# Patient Record
Sex: Female | Born: 1995 | Race: Black or African American | Hispanic: No | Marital: Single | Smoking: Never smoker
Health system: Southern US, Community
[De-identification: ages and names within clinical notes are randomized; demographics above are authoritative.]

## PROBLEM LIST (undated history)

## (undated) ENCOUNTER — Emergency Department: Discharge status: Absent without leave | Payer: Self-pay

---
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
## 2018-09-05 ENCOUNTER — Emergency Department (HOSPITAL_COMMUNITY): Payer: Self-pay

## 2018-09-05 ENCOUNTER — Emergency Department (HOSPITAL_COMMUNITY)
Admission: EM | Admit: 2018-09-05 | Discharge: 2018-09-05 | Disposition: A | Discharge status: Home / Self Care | Payer: Self-pay | Attending: Emergency Medicine | Admitting: Emergency Medicine

## 2018-09-05 ENCOUNTER — Other Ambulatory Visit: Payer: Self-pay

## 2018-09-05 ENCOUNTER — Encounter (HOSPITAL_COMMUNITY): Payer: Self-pay | Admitting: Emergency Medicine

## 2018-09-05 DIAGNOSIS — F410 Panic disorder [episodic paroxysmal anxiety] without agoraphobia: Secondary | ICD-10-CM | POA: Insufficient documentation

## 2018-09-05 LAB — BASIC METABOLIC PANEL
Anion gap: 10 (ref 5–15)
BUN: 11 mg/dL (ref 6–20)
CHLORIDE: 105 mmol/L (ref 98–111)
CO2: 23 mmol/L (ref 22–32)
Calcium: 9 mg/dL (ref 8.9–10.3)
Creatinine, Ser: 0.97 mg/dL (ref 0.44–1.00)
GFR calc non Af Amer: >60 mL/min (ref >60)
GLUCOSE: 131 mg/dL — AB (ref 70–99)
Potassium: 2.9 mmol/L — ABNORMAL LOW (ref 3.5–5.1)
Sodium: 138 mmol/L (ref 135–145)

## 2018-09-05 LAB — CBC
HEMATOCRIT: 37 % (ref 36.0–46.0)
HEMOGLOBIN: 12.4 g/dL (ref 12.0–15.0)
MCH: 31.3 pg (ref 26.0–34.0)
MCHC: 33.5 g/dL (ref 30.0–36.0)
MCV: 93.4 fL (ref 78.0–100.0)
Platelets: 473 10*3/uL — ABNORMAL HIGH (ref 150–400)
RBC: 3.96 MIL/uL (ref 3.87–5.11)
RDW: 12.5 % (ref 11.5–15.5)
WBC: 14.4 10*3/uL — ABNORMAL HIGH (ref 4.0–10.5)

## 2018-09-05 LAB — I-STAT BETA HCG BLOOD, ED (MC, WL, AP ONLY): I-stat hCG, quantitative: <5 m[IU]/mL (ref <5)

## 2018-09-05 MED ORDER — HYDROXYZINE HCL 25 MG PO TABS: 25.0000 mg | ORAL_TABLET | Freq: Three times a day (TID) | ORAL | 0 refills | Status: DC | PRN

## 2018-09-05 MED ORDER — LORAZEPAM 1 MG PO TABS
1.0000 mg | ORAL_TABLET | Freq: Once | ORAL | Status: AC
  Administered 2018-09-05: 1 mg via ORAL
  Filled 2018-09-05: qty 1

## 2018-09-05 NOTE — ED Triage Notes (Signed)
Pt C/O left sided facial and arm numbness that began around 0030. Pt states this started as chest discomfort and she states it "felt like I was having an anxiety attack."

## 2018-09-05 NOTE — ED Provider Notes (Signed)
[facility] EMERGENCY DEPARTMENT Provider Note   CSN: [id number] Arrival date & time: 09/05/18  0118     History   Chief Complaint Chief Complaint  Patient presents with  . Numbness    HPI Tonya Miles is a 22 y.o. female.  Presents to the emergency department for evaluation of chest pain, heart palpitations, shortness of breath, facial and arm numbness.  Patient reports that symptoms began 1 hour ago.  She was sitting and watching TV when symptoms began.  Initially she felt like her heart was racing and tightness in her chest, similar to panic attacks that she has had previously.  Symptoms persisted and then she started having numbness and tingling on the left side of her face and in her left arm.  She reports that these numbness symptoms have mostly resolved, has slight tingling still present in the arm.  She is still sensing palpitations.     History reviewed. No pertinent past medical history.  There are no active problems to display for this patient.   History reviewed. No pertinent surgical history.   OB History   None      Home Medications    Prior to Admission medications   Medication Sig Start Date End Date Taking? Authorizing Provider  hydrOXYzine (ATARAX/VISTARIL) 25 MG tablet Take 1 tablet (25 mg total) by mouth every 8 (eight) hours as needed for anxiety. 09/05/18   Gilda Crease, MD    Family History No family history on file.  Social History Social History   Tobacco Use  . Smoking status: Never Smoker  . Smokeless tobacco: Never Used  Substance Use Topics  . Alcohol use: Not on file  . Drug use: Not on file     Allergies   Patient has no allergy information on record.   Review of Systems Review of Systems  Respiratory: Positive for shortness of breath.   Cardiovascular: Positive for chest pain and palpitations.  Neurological: Positive for numbness.  All other systems reviewed and are negative.    Physical Exam Updated  Vital Signs BP 134/86   Pulse (!) 113   Temp 98.9 F (37.2 C) (Oral)   Resp (!) 22   LMP 08/26/2018   SpO2 99%   Physical Exam  Constitutional: She is oriented to person, place, and time. She appears well-developed and well-nourished. No distress.  HENT:  Head: Normocephalic and atraumatic.  Right Ear: Hearing normal.  Left Ear: Hearing normal.  Nose: Nose normal.  Mouth/Throat: Oropharynx is clear and moist and mucous membranes are normal.  Eyes: Pupils are equal, round, and reactive to light. Conjunctivae and EOM are normal.  Neck: Normal range of motion. Neck supple.  Cardiovascular: Regular rhythm, S1 normal and S2 normal. Tachycardia present. Exam reveals no gallop and no friction rub.  No murmur heard. Pulmonary/Chest: Effort normal and breath sounds normal. No respiratory distress. She exhibits no tenderness.  Abdominal: Soft. Normal appearance and bowel sounds are normal. There is no hepatosplenomegaly. There is no tenderness. There is no rebound, no guarding, no tenderness at McBurney's point and negative Murphy's sign. No hernia.  Musculoskeletal: Normal range of motion.  Neurological: She is alert and oriented to person, place, and time. She has normal strength. No cranial nerve deficit or sensory deficit. Coordination normal. GCS eye subscore is 4. GCS verbal subscore is 5. GCS motor subscore is 6.  Skin: Skin is warm, dry and intact. No rash noted. No cyanosis.  Psychiatric: Her speech is normal and behavior  is normal. Thought content normal. Her mood appears anxious.  Nursing note and vitals reviewed.    ED Treatments / Results  Labs (all labs ordered are listed, but only abnormal results are displayed) Labs Reviewed  CBC - Abnormal; Notable for the following components:      Result Value   WBC 14.4 (*)    Platelets 473 (*)    All other components within normal limits  BASIC METABOLIC PANEL - Abnormal; Notable for the following components:   Potassium 2.9 (*)      Glucose, Bld 131 (*)    All other components within normal limits  I-STAT BETA HCG BLOOD, ED ([facility], [facility], [facility] ONLY)    EKG EKG Interpretation  Date/Time:  Friday September 05 2018 01:29:59 EDT Ventricular Rate:  128 PR Interval:    QRS Duration: 91 QT Interval:  306 QTC Calculation: 447 R Axis:   62 Text Interpretation:  Sinus tachycardia Borderline Q waves in lateral leads Borderline repolarization abnormality No previous tracing Confirmed by Gilda CreasePollina, Din Bookwalter J [id number]) on 09/05/2018 1:34:56 AM   Radiology Dg Chest 2 View  Result Date: 09/05/2018 CLINICAL DATA:  Initial evaluation for acute chest pain. EXAM: CHEST - 2 VIEW COMPARISON:  None. FINDINGS: The cardiac and mediastinal silhouettes are within normal limits. The lungs are normally inflated. No airspace consolidation, pleural effusion, or pulmonary edema is identified. There is no pneumothorax. No acute osseous abnormality identified. IMPRESSION: No active cardiopulmonary disease. Electronically Signed   By: Rise MuBenjamin  McClintock M.D.   On: 09/05/2018 02:03    Procedures Procedures (including critical care time)  Medications Ordered in ED Medications  LORazepam (ATIVAN) tablet 1 mg (1 mg Oral Given 09/05/18 0146)     Initial Impression / Assessment and Plan / ED Course  I have reviewed the triage vital signs and the nursing notes.  Pertinent labs & imaging results that were available during my care of the patient were reviewed by me and considered in my medical decision making (see chart for details).     Patient pesents with shortness of breath, tightness in her chest followed by onset of numbness and tingling on the left side of her body.  Patient has a history of anxiety and panic attack.  Patient was tachycardic at arrival, hypertensive.  No hypoxia.  Lungs are clear.  Chest x-ray is normal, basic lab work-up unremarkable.  No evidence of pregnancy.  Patient administered Ativan with improvement.  Upon reentering the  room, heart rate was in the 90s, she was resting comfortably talking with her aunt.  Her heart rate did come back up after I started talking to her in the room, she appeared to start having more anxiety.  She does not have any risk factors for PE.  Do not feel the patient requires any further work-up at this time, will have her follow-up with her primary care doctor as an outpatient for consideration of further treatment for anxiety and panic disorder.  Final Clinical Impressions(s) / ED Diagnoses   Final diagnoses:  Panic attacks    ED Discharge Orders         Ordered    hydrOXYzine (ATARAX/VISTARIL) 25 MG tablet  Every 8 hours PRN     09/05/18 0250           Gilda CreasePollina, Traycen Goyer J, MD 09/05/18 [phone]

## 2018-09-05 NOTE — ED Notes (Signed)
Pt resting with eyes closed.

## 2019-07-16 ENCOUNTER — Other Ambulatory Visit: Payer: Self-pay | Admitting: Internal Medicine

## 2019-07-16 DIAGNOSIS — Z20822 Contact with and (suspected) exposure to covid-19: Secondary | ICD-10-CM

## 2019-07-19 LAB — NOVEL CORONAVIRUS, NAA: SARS-CoV-2, NAA: NOT DETECTED

## 2019-07-22 ENCOUNTER — Telehealth: Payer: Self-pay | Admitting: General Practice

## 2019-07-22 NOTE — Telephone Encounter (Signed)
Pt made aware of negative results, expressed understanding  °

## 2020-07-29 ENCOUNTER — Ambulatory Visit
Admission: EM | Admit: 2020-07-29 | Discharge: 2020-07-29 | Disposition: A | Discharge status: Home / Self Care | Payer: BC Managed Care – PPO | Attending: Internal Medicine | Admitting: Internal Medicine

## 2020-07-29 ENCOUNTER — Other Ambulatory Visit: Payer: Self-pay

## 2020-07-29 ENCOUNTER — Encounter: Payer: Self-pay | Admitting: Emergency Medicine

## 2020-07-29 DIAGNOSIS — U071 COVID-19: Secondary | ICD-10-CM | POA: Insufficient documentation

## 2020-07-29 DIAGNOSIS — Z79899 Other long term (current) drug therapy: Secondary | ICD-10-CM | POA: Diagnosis not present

## 2020-07-29 DIAGNOSIS — R05 Cough: Secondary | ICD-10-CM | POA: Diagnosis present

## 2020-07-29 DIAGNOSIS — R509 Fever, unspecified: Secondary | ICD-10-CM | POA: Diagnosis not present

## 2020-07-29 DIAGNOSIS — J069 Acute upper respiratory infection, unspecified: Secondary | ICD-10-CM | POA: Insufficient documentation

## 2020-07-29 LAB — SARS CORONAVIRUS 2 (TAT 6-24 HRS): SARS Coronavirus 2: POSITIVE — AB

## 2020-07-29 NOTE — ED Provider Notes (Signed)
[facility]    CSN: [id number] Arrival date & time: 07/29/20  8144      History   Chief Complaint Chief Complaint  Patient presents with   Fever   Cough    HPI Tonya Miles is a 24 y.o. female comes to the urgent care with complaints of fever of 101.2 Fahrenheit, cough of 1 day duration.  Patient is not vaccinated against COVID-19 virus.  She denies any nausea, vomiting or diarrhea.  No generalized body aches.  No sick contacts.  Patient denies any dizziness.  Cough is minimally productive.  No shortness of breath or wheezing.  Patient does not smoke cigarettes. History reviewed. No pertinent past medical history.  There are no problems to display for this patient.   History reviewed. No pertinent surgical history.  OB History   No obstetric history on file.      Home Medications    Prior to Admission medications   Medication Sig Start Date End Date Taking? Authorizing Provider  levocetirizine (XYZAL) 5 MG tablet Take 5 mg by mouth every evening.   Yes [provider]    Family History Family History  Problem Relation Age of Onset   Diabetes Mother    Fibromyalgia Mother     Social History Social History   Tobacco Use   Smoking status: Never Smoker   Smokeless tobacco: Never Used  Building services engineer Use: Never used  Substance Use Topics   Alcohol use: Yes   Drug use: Never     Allergies   Patient has no known allergies.   Review of Systems Review of Systems  Constitutional: Positive for fever. Negative for activity change, chills and fatigue.  HENT: Negative.   Respiratory: Positive for cough. Negative for shortness of breath and wheezing.   Gastrointestinal: Negative.   Genitourinary: Negative.   Neurological: Negative.      Physical Exam Triage Vital Signs ED Triage Vitals  Enc Vitals Group     BP 07/29/20 0850 134/90     Pulse Rate 07/29/20 0850 90     Resp 07/29/20 0850 14     Temp 07/29/20 0850  98.8 F (37.1 C)     Temp Source 07/29/20 0850 Oral     SpO2 07/29/20 0850 99 %     Weight 07/29/20 0844 150 lb (68 kg)     Height 07/29/20 0844 5\' 2"  (1.575 m)     Head Circumference --      Peak Flow --      Pain Score 07/29/20 0844 2     Pain Loc --      Pain Edu? --      Excl. in GC? --    No data found.  Updated Vital Signs BP 134/90 (BP Location: Right Arm)    Pulse 90    Temp 98.8 F (37.1 C) (Oral)    Resp 14    Ht 5\' 2"  (1.575 m)    Wt 68 kg    LMP 07/25/2020 (Exact Date)    SpO2 99%    BMI 27.44 kg/m   Visual Acuity Right Eye Distance:   Left Eye Distance:   Bilateral Distance:    Right Eye Near:   Left Eye Near:    Bilateral Near:     Physical Exam Vitals reviewed.  HENT:     Right Ear: Tympanic membrane normal.     Left Ear: Tympanic membrane normal.     Mouth/Throat:  Mouth: Mucous membranes are moist.     Pharynx: No oropharyngeal exudate or posterior oropharyngeal erythema.  Cardiovascular:     Rate and Rhythm: Normal rate and regular rhythm.     Pulses: Normal pulses.     Heart sounds: Normal heart sounds.  Abdominal:     General: Bowel sounds are normal. There is no distension.     Palpations: Abdomen is soft.     Tenderness: There is no guarding.     Hernia: No hernia is present.  Musculoskeletal:     Cervical back: Normal range of motion. No rigidity.  Lymphadenopathy:     Cervical: No cervical adenopathy.      UC Treatments / Results  Labs (all labs ordered are listed, but only abnormal results are displayed) Labs Reviewed  SARS CORONAVIRUS 2 (TAT 6-24 HRS)    EKG   Radiology No results found.  Procedures Procedures (including critical care time)  Medications Ordered in UC Medications - No data to display  Initial Impression / Assessment and Plan / UC Course  I have reviewed the triage vital signs and the nursing notes.  Pertinent labs & imaging results that were available during my care of the patient were reviewed by  me and considered in my medical decision making (see chart for details).     1.  Viral URI with cough: COVID-19 PCR test sent Covid vaccination counseling was given.  Patient's questions and concerns were answered. Patient is advised to quarantine until COVID-19 test results are available If patient symptoms worsens he is advised to return to urgent care to be reevaluated. Final Clinical Impressions(s) / UC Diagnoses   Final diagnoses:  Viral URI with cough     Discharge Instructions     Please quarantine until covid-19 test results are available. Please return to urgent care if your symptoms worsen.   ED Prescriptions    None     PDMP not reviewed this encounter.   Merrilee Jansky, MD 07/29/20 [phone]

## 2020-07-29 NOTE — ED Triage Notes (Signed)
Patient c/o cough and fever that started yesterday.

## 2020-07-29 NOTE — Discharge Instructions (Signed)
Please quarantine until covid-19 test results are available. Please return to urgent care if your symptoms worsen.

## 2020-08-02 ENCOUNTER — Other Ambulatory Visit: Payer: Self-pay

## 2020-08-02 ENCOUNTER — Emergency Department (HOSPITAL_COMMUNITY)
Admission: EM | Admit: 2020-08-02 | Discharge: 2020-08-03 | Disposition: A | Discharge status: Home / Self Care | Payer: BC Managed Care – PPO | Attending: Emergency Medicine | Admitting: Emergency Medicine

## 2020-08-02 DIAGNOSIS — U071 COVID-19: Secondary | ICD-10-CM | POA: Insufficient documentation

## 2020-08-02 DIAGNOSIS — R0981 Nasal congestion: Secondary | ICD-10-CM | POA: Diagnosis not present

## 2020-08-02 DIAGNOSIS — R0789 Other chest pain: Secondary | ICD-10-CM | POA: Insufficient documentation

## 2020-08-02 DIAGNOSIS — R05 Cough: Secondary | ICD-10-CM | POA: Insufficient documentation

## 2020-08-02 DIAGNOSIS — R112 Nausea with vomiting, unspecified: Secondary | ICD-10-CM | POA: Insufficient documentation

## 2020-08-02 MED ORDER — SODIUM CHLORIDE 0.9 % IV BOLUS
1000.0000 mL | Freq: Once | INTRAVENOUS | Status: AC
  Administered 2020-08-02: 1000 mL via INTRAVENOUS

## 2020-08-02 MED ORDER — ONDANSETRON HCL 4 MG/2ML IJ SOLN
4.0000 mg | Freq: Once | INTRAMUSCULAR | Status: AC
  Administered 2020-08-03: 4 mg via INTRAVENOUS
  Filled 2020-08-02: qty 2

## 2020-08-02 NOTE — ED Provider Notes (Signed)
[facility] EMERGENCY DEPARTMENT Provider Note   CSN: [id number] Arrival date & time: 08/02/20  2215     History Chief Complaint  Patient presents with  . Emesis  . COVD+    8/13    Tonya Miles is a 24 y.o. female.  Patient presents to the ED for nausea and vomiting.  Patient was diagnosed with Covid 4 days ago.  At that time she was having nasal congestion and cough.  Patient reports that the cough has resolved but she does feel some tightness on the right side of her chest when she breathes.  She is not short of breath.  Patient has not had any abdominal pain associated with nausea and vomiting tonight.  She has not had any recent fever.        No past medical history on file.  There are no problems to display for this patient.   No past surgical history on file.   OB History   No obstetric history on file.     Family History  Problem Relation Age of Onset  . Diabetes Mother   . Fibromyalgia Mother     Social History   Tobacco Use  . Smoking status: Never Smoker  . Smokeless tobacco: Never Used  Vaping Use  . Vaping Use: Never used  Substance Use Topics  . Alcohol use: Yes  . Drug use: Never    Home Medications Prior to Admission medications   Medication Sig Start Date End Date Taking? Authorizing Provider  levocetirizine (XYZAL) 5 MG tablet Take 5 mg by mouth every evening.    [provider]    Allergies    Patient has no known allergies.  Review of Systems   Review of Systems  Respiratory: Positive for chest tightness.   Gastrointestinal: Positive for nausea and vomiting.  All other systems reviewed and are negative.   Physical Exam Updated Vital Signs BP (!) 150/90   Pulse 79   Temp 98.5 F (36.9 C) (Oral)   Resp 16   Ht 5\' 2"  (1.575 m)   Wt 68 kg   LMP 07/25/2020 (Exact Date)   SpO2 99%   BMI 27.44 kg/m   Physical Exam Vitals and nursing note reviewed.  Constitutional:      General: She is not in acute  distress.    Appearance: Normal appearance. She is well-developed.  HENT:     Head: Normocephalic and atraumatic.     Right Ear: Hearing normal.     Left Ear: Hearing normal.     Nose: Nose normal.  Eyes:     Conjunctiva/sclera: Conjunctivae normal.     Pupils: Pupils are equal, round, and reactive to light.  Cardiovascular:     Rate and Rhythm: Regular rhythm.     Heart sounds: S1 normal and S2 normal. No murmur heard.  No friction rub. No gallop.   Pulmonary:     Effort: Pulmonary effort is normal. No respiratory distress.     Breath sounds: Normal breath sounds.  Chest:     Chest wall: No tenderness.  Abdominal:     General: Bowel sounds are normal.     Palpations: Abdomen is soft.     Tenderness: There is no abdominal tenderness. There is no guarding or rebound. Negative signs include Murphy's sign and McBurney's sign.     Hernia: No hernia is present.  Musculoskeletal:        General: Normal range of motion.     Cervical back:  Normal range of motion and neck supple.  Skin:    General: Skin is warm and dry.     Findings: No rash.  Neurological:     Mental Status: She is alert and oriented to person, place, and time.     GCS: GCS eye subscore is 4. GCS verbal subscore is 5. GCS motor subscore is 6.     Cranial Nerves: No cranial nerve deficit.     Sensory: No sensory deficit.     Coordination: Coordination normal.  Psychiatric:        Speech: Speech normal.        Behavior: Behavior normal.        Thought Content: Thought content normal.     ED Results / Procedures / Treatments   Labs (all labs ordered are listed, but only abnormal results are displayed) Labs Reviewed  COMPREHENSIVE METABOLIC PANEL - Abnormal; Notable for the following components:      Result Value   Glucose, Bld 102 (*)    All other components within normal limits  URINALYSIS, ROUTINE W REFLEX MICROSCOPIC - Abnormal; Notable for the following components:   APPearance HAZY (*)    Hgb urine  dipstick SMALL (*)    Leukocytes,Ua TRACE (*)    All other components within normal limits  CBC WITH DIFFERENTIAL/PLATELET  LIPASE, BLOOD  D-DIMER, QUANTITATIVE (NOT AT [facility])  POC URINE PREG, ED    EKG None  Radiology No results found.  Procedures Procedures (including critical care time)  Medications Ordered in ED Medications  sodium chloride 0.9 % bolus 1,000 mL (1,000 mLs Intravenous New Bag/Given 08/02/20 2359)  ondansetron (ZOFRAN) injection 4 mg (4 mg Intravenous Given 08/03/20 0000)    ED Course  I have reviewed the triage vital signs and the nursing notes.  Pertinent labs & imaging results that were available during my care of the patient were reviewed by me and considered in my medical decision making (see chart for details).    MDM Rules/Calculators/A&P                         Patient presents to the emergency department mainly for evaluation of nausea and vomiting which began today.  Patient is not experiencing any concomitant abdominal pain.  Abdominal exam is benign, nontender.  No concern for an acute surgical process.  Patient also reports that she feels a tightness in the right side of her chest with breathing.  She has not tachycardic, tachypneic or hypoxic.  She did test positive for Covid earlier this week.  Covid pneumonia and PE are therefore in the differential diagnosis.  D-dimer, however, is negative.  Therefore doubt PE.  Chest x-ray does not show any obvious pneumonia.  Patient's work-up is very reassuring.  She feels and looks well.  Will discharge with symptomatic treatment.  Was given printed return precautions.  Final Clinical Impression(s) / ED Diagnoses Final diagnoses:  Non-intractable vomiting with nausea, unspecified vomiting type  COVID-19    Rx / DC Orders ED Discharge Orders    None       Daesha Insco, Canary Brim, MD 08/03/20 [phone]

## 2020-08-02 NOTE — ED Triage Notes (Addendum)
Pt POV from home c/o emesis today. COVID + 8/13.  Emesis x1. Denies -nausea -cough

## 2020-08-03 ENCOUNTER — Emergency Department (HOSPITAL_COMMUNITY): Payer: BC Managed Care – PPO

## 2020-08-03 LAB — URINALYSIS, ROUTINE W REFLEX MICROSCOPIC
Bacteria, UA: NONE SEEN
Bilirubin Urine: NEGATIVE
Glucose, UA: NEGATIVE mg/dL
Ketones, ur: NEGATIVE mg/dL
Nitrite: NEGATIVE
Protein, ur: NEGATIVE mg/dL
Specific Gravity, Urine: 1.019 (ref 1.005–1.030)
pH: 6 (ref 5.0–8.0)

## 2020-08-03 LAB — COMPREHENSIVE METABOLIC PANEL
ALT: 26 U/L (ref 0–44)
AST: 22 U/L (ref 15–41)
Albumin: 4.3 g/dL (ref 3.5–5.0)
Alkaline Phosphatase: 63 U/L (ref 38–126)
Anion gap: 9 (ref 5–15)
BUN: 12 mg/dL (ref 6–20)
CO2: 24 mmol/L (ref 22–32)
Calcium: 8.9 mg/dL (ref 8.9–10.3)
Chloride: 107 mmol/L (ref 98–111)
Creatinine, Ser: 0.8 mg/dL (ref 0.44–1.00)
GFR calc Af Amer: >60 mL/min (ref >60)
GFR calc non Af Amer: >60 mL/min (ref >60)
Glucose, Bld: 102 mg/dL — ABNORMAL HIGH (ref 70–99)
Potassium: 3.7 mmol/L (ref 3.5–5.1)
Sodium: 140 mmol/L (ref 135–145)
Total Bilirubin: 0.3 mg/dL (ref 0.3–1.2)
Total Protein: 7.7 g/dL (ref 6.5–8.1)

## 2020-08-03 LAB — CBC WITH DIFFERENTIAL/PLATELET
Abs Immature Granulocytes: 0.01 10*3/uL (ref 0.00–0.07)
Basophils Absolute: 0 10*3/uL (ref 0.0–0.1)
Basophils Relative: 0 %
Eosinophils Absolute: 0.1 10*3/uL (ref 0.0–0.5)
Eosinophils Relative: 1 %
HCT: 38.9 % (ref 36.0–46.0)
Hemoglobin: 12.7 g/dL (ref 12.0–15.0)
Immature Granulocytes: 0 %
Lymphocytes Relative: 23 %
Lymphs Abs: 1.7 10*3/uL (ref 0.7–4.0)
MCH: 30.7 pg (ref 26.0–34.0)
MCHC: 32.6 g/dL (ref 30.0–36.0)
MCV: 94 fL (ref 80.0–100.0)
Monocytes Absolute: 0.7 10*3/uL (ref 0.1–1.0)
Monocytes Relative: 9 %
Neutro Abs: 4.9 10*3/uL (ref 1.7–7.7)
Neutrophils Relative %: 67 %
Platelets: 300 10*3/uL (ref 150–400)
RBC: 4.14 MIL/uL (ref 3.87–5.11)
RDW: 12.1 % (ref 11.5–15.5)
WBC: 7.4 10*3/uL (ref 4.0–10.5)
nRBC: 0 % (ref 0.0–0.2)

## 2020-08-03 LAB — D-DIMER, QUANTITATIVE: D-Dimer, Quant: 0.27 ug/mL-FEU (ref 0.00–0.50)

## 2020-08-03 LAB — POC URINE PREG, ED: Preg Test, Ur: NEGATIVE

## 2020-08-03 LAB — LIPASE, BLOOD: Lipase: 33 U/L (ref 11–51)

## 2020-08-03 MED ORDER — DEXAMETHASONE SODIUM PHOSPHATE 10 MG/ML IJ SOLN
10.0000 mg | Freq: Once | INTRAMUSCULAR | Status: AC
  Administered 2020-08-03: 10 mg via INTRAVENOUS
  Filled 2020-08-03: qty 1

## 2020-08-03 MED ORDER — ONDANSETRON HCL 4 MG PO TABS: 4.0000 mg | ORAL_TABLET | Freq: Four times a day (QID) | ORAL | 0 refills | Status: DC

## 2020-09-08 ENCOUNTER — Other Ambulatory Visit: Payer: Self-pay

## 2020-09-08 ENCOUNTER — Ambulatory Visit
Admission: EM | Admit: 2020-09-08 | Discharge: 2020-09-08 | Disposition: A | Discharge status: Home / Self Care | Payer: BC Managed Care – PPO | Attending: Emergency Medicine | Admitting: Emergency Medicine

## 2020-09-08 DIAGNOSIS — R350 Frequency of micturition: Secondary | ICD-10-CM | POA: Insufficient documentation

## 2020-09-08 LAB — POCT URINALYSIS DIP (MANUAL ENTRY)
Bilirubin, UA: NEGATIVE
Glucose, UA: NEGATIVE mg/dL
Ketones, POC UA: NEGATIVE mg/dL
Nitrite, UA: NEGATIVE
Protein Ur, POC: NEGATIVE mg/dL
Spec Grav, UA: 1.01 (ref 1.010–1.025)
Urobilinogen, UA: 0.2 E.U./dL
pH, UA: 6 (ref 5.0–8.0)

## 2020-09-08 LAB — POCT URINE PREGNANCY: Preg Test, Ur: NEGATIVE

## 2020-09-08 MED ORDER — NITROFURANTOIN MONOHYD MACRO 100 MG PO CAPS: 100.0000 mg | ORAL_CAPSULE | Freq: Two times a day (BID) | ORAL | 0 refills | Status: DC

## 2020-09-08 NOTE — Discharge Instructions (Addendum)
Urine concerning for UTI Urine culture sent.  We will call you with abnormal results.   Push fluids and get plenty of rest.   Take antibiotic as directed and to completion Follow up with PCP if symptoms persists Return here or go to ER if you have any new or worsening symptoms such as fever, worsening abdominal pain, nausea/vomiting, flank pain, etc... 

## 2020-09-08 NOTE — ED Triage Notes (Signed)
Pt presents with c/o urinary frequency

## 2020-09-08 NOTE — ED Provider Notes (Signed)
[facility]   CC: Urinary frequency  SUBJECTIVE:  Tonya Miles is a 24 y.o. female who complains of urinary frequency and urgency x 3 days.  Admits to excessive caffeine.  Denies pain.  Has NOT tried OTC medications.  Symptoms are made worse with urination.  Admits to similar symptoms in the past.  Denies fever, chills, nausea, vomiting, abdominal pain, flank pain, abnormal vaginal discharge or bleeding, hematuria.    LMP: Patient's last menstrual period was 08/14/2020 (approximate).  ROS: As in HPI.  All other pertinent ROS negative.     History reviewed. No pertinent past medical history. History reviewed. No pertinent surgical history. No Known Allergies No current facility-administered medications on file prior to encounter.   Current Outpatient Medications on File Prior to Encounter  Medication Sig Dispense Refill  . levocetirizine (XYZAL) 5 MG tablet Take 5 mg by mouth every evening.     Social History   Socioeconomic History  . Marital status: Single    Spouse name: Not on file  . Number of children: Not on file  . Years of education: Not on file  . Highest education level: Not on file  Occupational History  . Not on file  Tobacco Use  . Smoking status: Never Smoker  . Smokeless tobacco: Never Used  Vaping Use  . Vaping Use: Never used  Substance and Sexual Activity  . Alcohol use: Yes  . Drug use: Never  . Sexual activity: Not on file  Other Topics Concern  . Not on file  Social History Narrative  . Not on file   Social Determinants of Health   Financial Resource Strain:   . Difficulty of Paying Living Expenses: Not on file  Food Insecurity:   . Worried About Programme researcher, broadcasting/film/video in the Last Year: Not on file  . Ran Out of Food in the Last Year: Not on file  Transportation Needs:   . Lack of Transportation (Medical): Not on file  . Lack of Transportation (Non-Medical): Not on file  Physical Activity:   . Days of Exercise per Week: Not on  file  . Minutes of Exercise per Session: Not on file  Stress:   . Feeling of Stress : Not on file  Social Connections:   . Frequency of Communication with Friends and Family: Not on file  . Frequency of Social Gatherings with Friends and Family: Not on file  . Attends Religious Services: Not on file  . Active Member of Clubs or Organizations: Not on file  . Attends Banker Meetings: Not on file  . Marital Status: Not on file  Intimate Partner Violence:   . Fear of Current or Ex-Partner: Not on file  . Emotionally Abused: Not on file  . Physically Abused: Not on file  . Sexually Abused: Not on file   Family History  Problem Relation Age of Onset  . Diabetes Mother   . Fibromyalgia Mother     OBJECTIVE:  Vitals:   09/08/20 1820  BP: (!) 144/83  Pulse: 93  Resp: 17  Temp: 98.5 F (36.9 C)  TempSrc: Oral  SpO2: 98%   General appearance: Alert in no acute distress HEENT: NCAT.  Oropharynx clear.  Lungs: clear to auscultation bilaterally without adventitious breath sounds Heart: regular rate and rhythm.   Abdomen: soft; non-distended; no tenderness; bowel sounds present; no guarding  Back: no CVA tenderness Extremities: no edema; symmetrical with no gross deformities Skin: warm and dry Neurologic: Ambulates from  chair to exam table without difficulty Psychological: alert and cooperative; normal mood and affect  Labs Reviewed  POCT URINALYSIS DIP (MANUAL ENTRY) - Abnormal; Notable for the following components:      Result Value   Blood, UA trace-intact (*)    Leukocytes, UA Small (1+) (*)    All other components within normal limits  URINE CULTURE  POCT URINE PREGNANCY    ASSESSMENT & PLAN:  1. Urinary frequency     Meds ordered this encounter  Medications  . nitrofurantoin, macrocrystal-monohydrate, (MACROBID) 100 MG capsule    Sig: Take 1 capsule (100 mg total) by mouth 2 (two) times daily.    Dispense:  10 capsule    Refill:  0    Order  Specific Question:   Supervising Provider    Answer:   Eustace Moore [[id number]]   Urine concerning for UTI Urine culture sent.  We will call you with abnormal results.   Push fluids and get plenty of rest.   Take antibiotic as directed and to completion Take pyridium as prescribed and as needed for symptomatic relief Follow up with PCP if symptoms persists Return here or go to ER if you have any new or worsening symptoms such as fever, worsening abdominal pain, nausea/vomiting, flank pain, etc...  Outlined signs and symptoms indicating need for more acute intervention. Patient verbalized understanding. After Visit Summary given.     Rennis Harding, PA-C 09/08/20 1839

## 2020-09-10 LAB — URINE CULTURE

## 2020-12-11 ENCOUNTER — Ambulatory Visit
Admission: EM | Admit: 2020-12-11 | Discharge: 2020-12-11 | Disposition: A | Discharge status: Home / Self Care | Payer: BC Managed Care – PPO | Attending: Family Medicine | Admitting: Family Medicine

## 2020-12-11 ENCOUNTER — Other Ambulatory Visit: Payer: Self-pay

## 2020-12-11 DIAGNOSIS — R300 Dysuria: Secondary | ICD-10-CM

## 2020-12-11 DIAGNOSIS — R350 Frequency of micturition: Secondary | ICD-10-CM

## 2020-12-11 DIAGNOSIS — N3000 Acute cystitis without hematuria: Secondary | ICD-10-CM

## 2020-12-11 LAB — POCT URINALYSIS DIP (MANUAL ENTRY)
Bilirubin, UA: NEGATIVE
Glucose, UA: NEGATIVE mg/dL
Leukocytes, UA: NEGATIVE
Nitrite, UA: NEGATIVE
Protein Ur, POC: NEGATIVE mg/dL
Spec Grav, UA: 1.025 (ref 1.010–1.025)
Urobilinogen, UA: 0.2 E.U./dL
pH, UA: 7 (ref 5.0–8.0)

## 2020-12-11 MED ORDER — CEPHALEXIN 500 MG PO CAPS: 500.0000 mg | ORAL_CAPSULE | Freq: Two times a day (BID) | ORAL | 0 refills | Status: AC

## 2020-12-11 NOTE — ED Provider Notes (Signed)
[facility]   CC: UTI  SUBJECTIVE:  Tonya Miles is a 24 y.o. female who complains of urinary frequency, urgency and dysuria for the past 2 days.  Patient denies a precipitating event, recent sexual encounter, excessive caffeine intake. Localizes the pain to the lower abdomen. Pain is intermittent and describes it as burning. Has not tried OTC medications. Symptoms are made worse with urination. Admits to similar symptoms in the past.  Denies fever, chills, nausea, vomiting, flank pain, abnormal vaginal discharge or bleeding, hematuria.    LMP: No LMP recorded (lmp unknown).  ROS: As in HPI.  All other pertinent ROS negative.     No past medical history on file. No past surgical history on file. No Known Allergies No current facility-administered medications on file prior to encounter.   Current Outpatient Medications on File Prior to Encounter  Medication Sig Dispense Refill   levocetirizine (XYZAL) 5 MG tablet Take 5 mg by mouth every evening.     Social History   Socioeconomic History   Marital status: Single    Spouse name: Not on file   Number of children: Not on file   Years of education: Not on file   Highest education level: Not on file  Occupational History   Not on file  Tobacco Use   Smoking status: Never Smoker   Smokeless tobacco: Never Used  Vaping Use   Vaping Use: Never used  Substance and Sexual Activity   Alcohol use: Yes   Drug use: Never   Sexual activity: Not on file  Other Topics Concern   Not on file  Social History Narrative   Not on file   Social Determinants of Health   Financial Resource Strain: Not on file  Food Insecurity: Not on file  Transportation Needs: Not on file  Physical Activity: Not on file  Stress: Not on file  Social Connections: Not on file  Intimate Partner Violence: Not on file   Family History  Problem Relation Age of Onset   Diabetes Mother    Fibromyalgia Mother      OBJECTIVE:  Vitals:   12/11/20 1526  BP: (!) 134/91  Pulse: 80  Resp: 18  Temp: 98 F (36.7 C)  SpO2: 99%   General appearance: AOx3 in no acute distress HEENT: NCAT. Oropharynx clear.  Lungs: clear to auscultation bilaterally without adventitious breath sounds Heart: regular rate and rhythm. Radial pulses 2+ symmetrical bilaterally Abdomen: soft; non-distended; suprapubic tenderness; bowel sounds present; no guarding or rebound tenderness Back: no CVA tenderness Extremities: no edema; symmetrical with no gross deformities Skin: warm and dry Neurologic: Ambulates from chair to exam table without difficulty Psychological: alert and cooperative; normal mood and affect  Labs Reviewed  POCT URINALYSIS DIP (MANUAL ENTRY) - Abnormal; Notable for the following components:      Result Value   Clarity, UA hazy (*)    Ketones, POC UA trace (5) (*)    Blood, UA trace-intact (*)    All other components within normal limits  URINE CULTURE    ASSESSMENT & PLAN:  1. Acute cystitis without hematuria   2. Dysuria   3. Urinary frequency     Meds ordered this encounter  Medications   cephALEXin (KEFLEX) 500 MG capsule    Sig: Take 1 capsule (500 mg total) by mouth 2 (two) times daily for 7 days.    Dispense:  14 capsule    Refill:  0    Order Specific Question:  Supervising Provider    Answer:   Merrilee Jansky [[id number]]   UA concerning for UTI Prescribed Keflex Urine culture sent  We will call you with abnormal results that need further treatment Push fluids and get plenty of rest Take antibiotic as directed and to completion Take pyridium as needed for symptomatic relief Follow up with PCP if symptoms persists Return here or go to ER if you have any new or worsening symptoms such as fever, worsening abdominal pain, nausea/vomiting, flank pain  Outlined signs and symptoms indicating need for more acute intervention Patient verbalized understanding After Visit  Summary given     Moshe Cipro, NP 12/13/20 [phone]

## 2020-12-11 NOTE — Discharge Instructions (Signed)
I have sent in Keflex for you to take twice a day for 7 days  We have sent your urine for a culture. We will call you with results that require further treatment  Follow up with this office or with primary care if symptoms are persisting.  Follow up in the ER for high fever, trouble swallowing, trouble breathing, other concerning symptoms.

## 2020-12-14 LAB — URINE CULTURE

## 2021-01-24 ENCOUNTER — Other Ambulatory Visit: Payer: Self-pay

## 2021-01-24 ENCOUNTER — Ambulatory Visit: Payer: Self-pay | Admitting: Family Medicine

## 2021-01-24 ENCOUNTER — Encounter: Payer: Self-pay | Admitting: Family Medicine

## 2021-01-24 DIAGNOSIS — Z113 Encounter for screening for infections with a predominantly sexual mode of transmission: Secondary | ICD-10-CM

## 2021-01-24 LAB — WET PREP FOR TRICH, YEAST, CLUE: Trichomonas Exam: NEGATIVE

## 2021-01-24 LAB — HIV ANTIBODY (ROUTINE TESTING W REFLEX): HIV 1&2 Ab, 4th Generation: NONREACTIVE

## 2021-01-24 NOTE — Progress Notes (Signed)
[employer] results reviewed by provider. Tawny Hopping, RN

## 2021-01-24 NOTE — Progress Notes (Signed)
[facility] Department STI clinic/screening visit  Subjective:  Tonya Miles is a 25 y.o. female being seen today for an STI screening visit. The patient reports they do not have symptoms.  Patient reports that they do not desire a pregnancy in the next year.   They reported they are not interested in discussing contraception today.  Patient's last menstrual period was 01/01/2021.   Patient has the following medical conditions:  There are no problems to display for this patient.   Chief Complaint  Patient presents with  . STD Screening    HPI  Patient reports here for STI testing, partner was unfaithful and wants to be screened.    Last HIV test per patient was 2019 Patient reports last pap was 2019  See flowsheet for further details and programmatic requirements.    The following portions of the patient's history were reviewed and updated as appropriate: allergies, current medications, past medical history, past social history, past surgical history and problem list.  Objective:  There were no vitals filed for this visit.  Physical Exam Vitals and nursing note reviewed.  Constitutional:      Appearance: Normal appearance.  HENT:     Head: Normocephalic and atraumatic.     Mouth/Throat:     Mouth: Mucous membranes are moist.     Pharynx: Oropharynx is clear. No oropharyngeal exudate or posterior oropharyngeal erythema.  Pulmonary:     Effort: Pulmonary effort is normal.  Chest:  Breasts:     Right: No axillary adenopathy or supraclavicular adenopathy.     Left: No axillary adenopathy or supraclavicular adenopathy.    Abdominal:     General: Abdomen is flat.     Palpations: Abdomen is soft. There is no mass.     Tenderness: There is no abdominal tenderness. There is no rebound.  Genitourinary:    Exam position: Lithotomy position.     Pubic Area: No rash or pubic lice.      Labia:        Right: No rash or lesion.        Left: No rash or lesion.       Vagina: Normal. No vaginal discharge, erythema, bleeding or lesions.     Cervix: No cervical motion tenderness, discharge, friability, lesion or erythema.     Uterus: Normal.      Adnexa: Right adnexa normal and left adnexa normal.     Comments: Deferred, patient self collected specimens  Musculoskeletal:     Cervical back: Normal range of motion and neck supple.  Lymphadenopathy:     Head:     Right side of head: No preauricular or posterior auricular adenopathy.     Left side of head: No preauricular or posterior auricular adenopathy.     Cervical: No cervical adenopathy.     Upper Body:     Right upper body: No supraclavicular or axillary adenopathy.     Left upper body: No supraclavicular or axillary adenopathy.     Lower Body: No right inguinal adenopathy. No left inguinal adenopathy.  Skin:    General: Skin is warm and dry.     Findings: No rash.  Neurological:     Mental Status: She is alert and oriented to person, place, and time.  Psychiatric:        Mood and Affect: Mood normal.        Behavior: Behavior normal.      Assessment and Plan:  Tonya Miles is a 25  y.o. female presenting to the [facility] Department for STI screening  1. Screening examination for venereal disease - Chlamydia/Gonorrhea [facility] - WET PREP FOR TRICH, YEAST, CLUE - HIV [place] [facility] - Syphilis Serology, [facility]  Patient accepted all screenings including oral GC, wet prep, vaginal CT/GC and bloodwork for HIV/RPR.  Patient meets criteria for HepB screening? No. Ordered? No - does not meet criteria  Patient meets criteria for HepC screening? No. Ordered? No - does not meet critera   Nursing: Treat wet prep per standing order.    Discussed time line for [facility] results and that patient will be called with positive results and encouraged patient to call if she had not heard in 2 weeks.   Counseled to return or seek care for continued or worsening  symptoms Recommended condom use with all sex  Patient is currently using no birth control method  to prevent pregnancy.     Return for as needed.  No future appointments.  Wendi Snipes, FNP

## 2021-01-24 NOTE — Progress Notes (Signed)
Here today for STD screening. Accepts bloodwork. Boy Delamater, RN ° °

## 2021-01-29 LAB — GONOCOCCUS CULTURE

## 2021-01-30 ENCOUNTER — Other Ambulatory Visit: Payer: Self-pay

## 2021-01-30 NOTE — Progress Notes (Signed)
RN abstracted HIV non reactive. Harvie Heck, RN

## 2021-04-25 ENCOUNTER — Other Ambulatory Visit: Payer: Self-pay

## 2021-04-25 ENCOUNTER — Ambulatory Visit
Admission: EM | Admit: 2021-04-25 | Discharge: 2021-04-25 | Disposition: A | Discharge status: Home / Self Care | Payer: Medicaid Other | Attending: Family Medicine | Admitting: Family Medicine

## 2021-04-25 DIAGNOSIS — H66003 Acute suppurative otitis media without spontaneous rupture of ear drum, bilateral: Secondary | ICD-10-CM

## 2021-04-25 MED ORDER — FLUCONAZOLE 150 MG PO TABS: ORAL_TABLET | ORAL | 0 refills | Status: DC

## 2021-04-25 MED ORDER — AMOXICILLIN-POT CLAVULANATE 875-125 MG PO TABS: 1.0000 | ORAL_TABLET | Freq: Two times a day (BID) | ORAL | 0 refills | Status: AC

## 2021-04-25 NOTE — ED Triage Notes (Signed)
Pt presents with c/o chest congestion and cough since last week also has left ear pain. Pt has had 2 negative home covid test

## 2021-04-25 NOTE — Discharge Instructions (Signed)
I have sent in Augmentin for you to take twice a day for 7 days.  I have sent in fluconazole in case of yeast. Take one tablet at the onset of symptoms. If symptoms are still present in 3 days, take the second tablet.   Follow up with this office or with primary care if symptoms are persisting.  Follow up in the ER for high fever, trouble swallowing, trouble breathing, other concerning symptoms.  

## 2021-04-25 NOTE — ED Provider Notes (Signed)
[facility]    CSN: [id number] Arrival date & time: 04/25/21  0850      History   Chief Complaint No chief complaint on file.   HPI Tonya Miles is a 25 y.o. female.   Reports nasal congestion, cough, chest congestion for the last week.  Also reports left ear pain.  Reports that she has had 2 negative home COVID test over the past couple of days.  Denies sick contacts.  Has taken ibuprofen with little relief of ear pain.  Has taken over-the-counter cough and cold with little relief of cough and congestion.  Denies abdominal pain, nausea, vomiting, diarrhea, rash, fever, other symptoms.  ROS per HPI  The history is provided by the patient.    No past medical history on file.  There are no problems to display for this patient.   No past surgical history on file.  OB History   No obstetric history on file.      Home Medications    Prior to Admission medications   Medication Sig Start Date End Date Taking? Authorizing Provider  amoxicillin-clavulanate (AUGMENTIN) 875-125 MG tablet Take 1 tablet by mouth 2 (two) times daily for 7 days. 04/25/21 05/02/21 Yes Moshe Cipro, NP  fluconazole (DIFLUCAN) 150 MG tablet Take one tablet at the onset of symptoms, if still having symptoms in 3 days, take the second tablet. 04/25/21  Yes Moshe Cipro, NP  levocetirizine (XYZAL) 5 MG tablet Take 5 mg by mouth every evening.    [provider]    Family History Family History  Problem Relation Age of Onset  . Diabetes Mother   . Fibromyalgia Mother     Social History Social History   Tobacco Use  . Smoking status: Never Smoker  . Smokeless tobacco: Never Used  Vaping Use  . Vaping Use: Never used  Substance Use Topics  . Alcohol use: Yes    Alcohol/week: 1.0 standard drink    Types: 1 Glasses of wine per week  . Drug use: Never     Allergies   Patient has no known allergies.   Review of Systems Review of Systems   Physical  Exam Triage Vital Signs ED Triage Vitals  Enc Vitals Group     BP 04/25/21 0935 134/84     Pulse Rate 04/25/21 0935 81     Resp 04/25/21 0935 16     Temp 04/25/21 0935 98.3 F (36.8 C)     Temp Source 04/25/21 0935 Oral     SpO2 04/25/21 0935 98 %     Weight --      Height --      Head Circumference --      Peak Flow --      Pain Score 04/25/21 0940 5     Pain Loc --      Pain Edu? --      Excl. in GC? --    No data found.  Updated Vital Signs BP 134/84 (BP Location: Right Arm)   Pulse 81   Temp 98.3 F (36.8 C) (Oral)   Resp 16   LMP  (LMP Unknown)   SpO2 98%   Physical Exam Vitals and nursing note reviewed.  Constitutional:      General: She is not in acute distress.    Appearance: Normal appearance. She is well-developed and normal weight. She is ill-appearing.  HENT:     Head: Normocephalic and atraumatic.     Right Ear: Ear canal and  external ear normal. A middle ear effusion is present. Tympanic membrane is erythematous and bulging.     Left Ear: Ear canal and external ear normal. A middle ear effusion is present. Tympanic membrane is erythematous and bulging.     Mouth/Throat:     Mouth: Mucous membranes are moist.     Pharynx: Posterior oropharyngeal erythema present.  Eyes:     Extraocular Movements: Extraocular movements intact.     Conjunctiva/sclera: Conjunctivae normal.     Pupils: Pupils are equal, round, and reactive to light.  Cardiovascular:     Rate and Rhythm: Normal rate and regular rhythm.     Heart sounds: Normal heart sounds. No murmur heard.   Pulmonary:     Effort: Pulmonary effort is normal. No respiratory distress.     Breath sounds: Normal breath sounds. No stridor. No wheezing, rhonchi or rales.  Chest:     Chest wall: No tenderness.  Abdominal:     Palpations: Abdomen is soft.     Tenderness: There is no abdominal tenderness.  Musculoskeletal:     Cervical back: Normal range of motion and neck supple.  Skin:    General: Skin  is warm and dry.     Capillary Refill: Capillary refill takes less than 2 seconds.  Neurological:     General: No focal deficit present.     Mental Status: She is alert and oriented to person, place, and time.  Psychiatric:        Mood and Affect: Mood normal.        Behavior: Behavior normal.        Thought Content: Thought content normal.      UC Treatments / Results  Labs (all labs ordered are listed, but only abnormal results are displayed) Labs Reviewed - No data to display  EKG   Radiology No results found.  Procedures Procedures (including critical care time)  Medications Ordered in UC Medications - No data to display  Initial Impression / Assessment and Plan / UC Course  I have reviewed the triage vital signs and the nursing notes.  Pertinent labs & imaging results that were available during my care of the patient were reviewed by me and considered in my medical decision making (see chart for details).    Bilateral otitis media  Prescribed Augmentin 875 twice daily x7 days Prescribe fluconazole in case of yeast Follow-up with this office or with primary care if symptoms are persisting Follow-up in the ER for high fever, trouble swallowing, trouble breathing, other concerning symptoms  Final Clinical Impressions(s) / UC Diagnoses   Final diagnoses:  Non-recurrent acute suppurative otitis media of both ears without spontaneous rupture of tympanic membranes     Discharge Instructions     I have sent in Augmentin for you to take twice a day for 7 days.  I have sent in fluconazole in case of yeast. Take one tablet at the onset of symptoms. If symptoms are still present in 3 days, take the second tablet.   Follow up with this office or with primary care if symptoms are persisting.  Follow up in the ER for high fever, trouble swallowing, trouble breathing, other concerning symptoms.     ED Prescriptions    Medication Sig Dispense Auth. Provider    amoxicillin-clavulanate (AUGMENTIN) 875-125 MG tablet Take 1 tablet by mouth 2 (two) times daily for 7 days. 14 tablet Moshe Cipro, NP   fluconazole (DIFLUCAN) 150 MG tablet Take one tablet at the onset  of symptoms, if still having symptoms in 3 days, take the second tablet. 2 tablet Moshe Cipro, NP     PDMP not reviewed this encounter.   Moshe Cipro, NP 04/25/21 1858

## 2021-06-26 ENCOUNTER — Ambulatory Visit
Admission: EM | Admit: 2021-06-26 | Discharge: 2021-06-26 | Disposition: A | Discharge status: Home / Self Care | Payer: Medicaid Other | Attending: Family Medicine | Admitting: Family Medicine

## 2021-06-26 DIAGNOSIS — Z1152 Encounter for screening for COVID-19: Secondary | ICD-10-CM

## 2021-06-26 DIAGNOSIS — B349 Viral infection, unspecified: Secondary | ICD-10-CM

## 2021-06-26 DIAGNOSIS — R509 Fever, unspecified: Secondary | ICD-10-CM

## 2021-06-26 MED ORDER — PSEUDOEPHEDRINE HCL ER 120 MG PO TB12: 120.0000 mg | ORAL_TABLET | Freq: Two times a day (BID) | ORAL | 0 refills | Status: DC

## 2021-06-26 NOTE — Discharge Instructions (Addendum)
Your COVID 19 results should result within 3-5 days. °Negative results are immediately resulted to Mychart.  ° °Positive results will receive a follow-up call from our clinic. If symptoms are present, I recommend home quarantine until results are known.  ° °Alternate Tylenol and ibuprofen as needed for body aches and fever.  Symptom management per recommendations discussed today.  If any breathing difficulty or chest pain develops go immediately to the closest emergency department for evaluation.  °

## 2021-06-26 NOTE — ED Provider Notes (Signed)
[facility]    CSN: [id number] Arrival date & time: 06/26/21  0836      History   Chief Complaint Chief Complaint  Patient presents with   Nasal Congestion    HPI Tonya Miles is a 25 y.o. female.   HPI Patient presents with URI symptoms including nasal congestion, fever TMAX 103 F, x 24 hours. No known exposure to COVID. COVID Vaccinated: N. Denies worrisome symptoms of shortness of breath, weakness, or chest pain. She has taken tylenol for fever only. Afebrile at present  History reviewed. No pertinent past medical history.  There are no problems to display for this patient.   History reviewed. No pertinent surgical history.  OB History   No obstetric history on file.      Home Medications    Prior to Admission medications   Medication Sig Start Date End Date Taking? Authorizing Provider  pseudoephedrine (SUDAFED 12 HOUR) 120 MG 12 hr tablet Take 1 tablet (120 mg total) by mouth 2 (two) times daily. 06/26/21  Yes Bing Neighbors, FNP  fluconazole (DIFLUCAN) 150 MG tablet Take one tablet at the onset of symptoms, if still having symptoms in 3 days, take the second tablet. 04/25/21   Moshe Cipro, NP  levocetirizine (XYZAL) 5 MG tablet Take 5 mg by mouth every evening.    [provider]    Family History Family History  Problem Relation Age of Onset   Diabetes Mother    Fibromyalgia Mother     Social History Social History   Tobacco Use   Smoking status: Never   Smokeless tobacco: Never  Vaping Use   Vaping Use: Never used  Substance Use Topics   Alcohol use: Yes    Alcohol/week: 1.0 standard drink    Types: 1 Glasses of wine per week   Drug use: Never     Allergies   Patient has no known allergies.   Review of Systems Review of Systems Pertinent negatives listed in HPI   Physical Exam Triage Vital Signs ED Triage Vitals [06/26/21 0950]  Enc Vitals Group     BP 116/70     Pulse Rate (!) 119     Resp 18      Temp 98.8 F (37.1 C)     Temp src      SpO2 96 %     Weight      Height      Head Circumference      Peak Flow      Pain Score      Pain Loc      Pain Edu?      Excl. in GC?    No data found.  Updated Vital Signs BP 116/70   Pulse (!) 119   Temp 98.8 F (37.1 C)   Resp 18   LMP 06/23/2021   SpO2 96%   Visual Acuity Right Eye Distance:   Left Eye Distance:   Bilateral Distance:    Right Eye Near:   Left Eye Near:    Bilateral Near:     Physical Exam  General Appearance:    Alert, acutely ill appearing, cooperative, no distress  HENT:   Normocephalic, ears normal, nares mucosal edema with congestion, rhinorrhea, oropharynx erythematous without exudate   Eyes:    PERRL, conjunctiva/corneas clear, EOM's intact       Lungs:     Clear to auscultation bilaterally, respirations unlabored  Heart:    Regular rate and rhythm  Neurologic:  Awake, alert, oriented x 3. No apparent focal neurological defect.     UC Treatments / Results  Labs (all labs ordered are listed, but only abnormal results are displayed) Labs Reviewed  COVID-19, FLU A+B NAA    EKG   Radiology No results found.  Procedures Procedures (including critical care time)  Medications Ordered in UC Medications - No data to display  Initial Impression / Assessment and Plan / UC Course  I have reviewed the triage vital signs and the nursing notes.  Pertinent labs & imaging results that were available during my care of the patient were reviewed by me and considered in my medical decision making (see chart for details).    Your COVID 19 results should result within 3-5 days. Negative results are immediately resulted to Mychart. Positive results will receive a follow-up call from our clinic. If symptoms are present, I recommend home quarantine until results are known.  Alternate Tylenol and ibuprofen as needed for body aches and fever.  Symptom management per recommendations discussed today.  If  any breathing difficulty or chest pain develops go immediately to the closest emergency department for evaluation.  Final Clinical Impressions(s) / UC Diagnoses   Final diagnoses:  Fever, unspecified  Encounter for screening for COVID-19  Viral illness     Discharge Instructions      Your COVID 19 results should result within 3-5 days. Negative results are immediately resulted to Mychart. Positive results will receive a follow-up call from our clinic. If symptoms are present, I recommend home quarantine until results are known.  Alternate Tylenol and ibuprofen as needed for body aches and fever.  Symptom management per recommendations discussed today.  If any breathing difficulty or chest pain develops go immediately to the closest emergency department for evaluation.    ED Prescriptions     Medication Sig Dispense Auth. Provider   pseudoephedrine (SUDAFED 12 HOUR) 120 MG 12 hr tablet Take 1 tablet (120 mg total) by mouth 2 (two) times daily. 30 tablet Bing Neighbors, FNP      PDMP not reviewed this encounter.   Bing Neighbors, FNP 06/26/21 1214

## 2021-06-26 NOTE — ED Triage Notes (Signed)
Pt presents with nasal congestion  and fever last night of 103 and also has ear pain in right ear

## 2021-06-27 LAB — COVID-19, FLU A+B NAA
Influenza A, NAA: NOT DETECTED
Influenza B, NAA: NOT DETECTED
SARS-CoV-2, NAA: DETECTED — AB

## 2021-12-02 ENCOUNTER — Ambulatory Visit
Admission: EM | Admit: 2021-12-02 | Discharge: 2021-12-02 | Disposition: A | Discharge status: Home / Self Care | Payer: Managed Care, Other (non HMO) | Attending: Family Medicine | Admitting: Family Medicine

## 2021-12-02 ENCOUNTER — Other Ambulatory Visit: Payer: Self-pay

## 2021-12-02 DIAGNOSIS — N760 Acute vaginitis: Secondary | ICD-10-CM

## 2021-12-02 DIAGNOSIS — L989 Disorder of the skin and subcutaneous tissue, unspecified: Secondary | ICD-10-CM | POA: Diagnosis not present

## 2021-12-02 LAB — POCT URINALYSIS DIP (MANUAL ENTRY)
Bilirubin, UA: NEGATIVE
Glucose, UA: NEGATIVE mg/dL
Ketones, POC UA: NEGATIVE mg/dL
Leukocytes, UA: NEGATIVE
Nitrite, UA: NEGATIVE
Protein Ur, POC: NEGATIVE mg/dL
Spec Grav, UA: >=1.03 — AB (ref 1.010–1.025)
Urobilinogen, UA: 0.2 E.U./dL
pH, UA: 6 (ref 5.0–8.0)

## 2021-12-02 LAB — POCT URINE PREGNANCY: Preg Test, Ur: NEGATIVE

## 2021-12-02 NOTE — ED Triage Notes (Signed)
Pt presents with vaginal itching and discomfort with sex. Pt states she has not had a period since October. Negative home pregnancy test x3. Pt states she also has a knot on her rt elbow that has been evaluated by her pcp, knot is not painful to touch.

## 2021-12-02 NOTE — Discharge Instructions (Signed)
Try an unscented soap like Dove unscented to wash your private area and avoid any scented products, feminine washes or wipes, douches as these can throw off the pH and cause irritation and infections.  You can also try boric acid vaginal suppositories which are available over-the-counter daily as needed to help combat irritation, itching, discharge, odor

## 2021-12-04 LAB — CERVICOVAGINAL ANCILLARY ONLY
Bacterial Vaginitis (gardnerella): POSITIVE — AB
Candida Glabrata: NEGATIVE
Candida Vaginitis: NEGATIVE
Chlamydia: NEGATIVE
Comment: NEGATIVE
Comment: NEGATIVE
Comment: NEGATIVE
Comment: NEGATIVE
Comment: NEGATIVE
Comment: NORMAL
Neisseria Gonorrhea: NEGATIVE
Trichomonas: NEGATIVE

## 2021-12-05 ENCOUNTER — Telehealth (HOSPITAL_COMMUNITY): Payer: Self-pay | Admitting: Emergency Medicine

## 2021-12-05 MED ORDER — METRONIDAZOLE 500 MG PO TABS: 500.0000 mg | ORAL_TABLET | Freq: Two times a day (BID) | ORAL | 0 refills | Status: DC

## 2021-12-06 NOTE — ED Provider Notes (Signed)
[facility]    CSN: [id number] Arrival date & time: 12/02/21  0827      History   Chief Complaint Chief Complaint  Patient presents with   Vaginal Itching    HPI Tonya Miles is a 25 y.o. female.   Patient presenting today with intermittent vaginal itching, dyspareunia off and on for several weeks.  She denies vaginal bleeding, significant vaginal discharge, rashes, lesions, new sexual partners.  She is also complaining of irregular periods since October.  She has had 3 negative home pregnancy tests, including one this past week.  She is not currently on any contraceptives and has never had abnormal periods in the past.  Denies fever, chills, dysuria, hematuria, abdominal pain, nausea vomiting diarrhea, concern for STDs.  Has not been trying anything over-the-counter for symptoms.  She is also concerned about a bump that has formed on her right elbow.  She showed her PCP the area but states they dismissed it as no big deal.  It is not painful to the touch, not growing in size, changing colors or draining anything.  No injury to the area.   History reviewed. No pertinent past medical history.  There are no problems to display for this patient.   History reviewed. No pertinent surgical history.  OB History   No obstetric history on file.      Home Medications    Prior to Admission medications   Medication Sig Start Date End Date Taking? Authorizing Provider  loratadine (ALLERGY) 10 MG tablet Take 10 mg by mouth daily.   Yes [provider]  fluconazole (DIFLUCAN) 150 MG tablet Take one tablet at the onset of symptoms, if still having symptoms in 3 days, take the second tablet. Patient not taking: Reported on 12/02/2021 04/25/21   Faustino Congress, NP  levocetirizine (XYZAL) 5 MG tablet Take 5 mg by mouth every evening. Patient not taking: Reported on 12/02/2021    [provider]  metroNIDAZOLE (FLAGYL) 500 MG tablet Take 1 tablet (500 mg  total) by mouth 2 (two) times daily. 12/05/21   Chase Picket, MD  pseudoephedrine (SUDAFED 12 HOUR) 120 MG 12 hr tablet Take 1 tablet (120 mg total) by mouth 2 (two) times daily. Patient not taking: Reported on 12/02/2021 06/26/21   Scot Jun, FNP    Family History Family History  Problem Relation Age of Onset   Diabetes Mother    Fibromyalgia Mother     Social History Social History   Tobacco Use   Smoking status: Never   Smokeless tobacco: Never  Vaping Use   Vaping Use: Never used  Substance Use Topics   Alcohol use: Yes    Alcohol/week: 1.0 standard drink    Types: 1 Glasses of wine per week   Drug use: Never     Allergies   Patient has no known allergies.   Review of Systems Review of Systems HPI  Physical Exam Triage Vital Signs ED Triage Vitals [12/02/21 0846]  Enc Vitals Group     BP 132/82     Pulse Rate 77     Resp 14     Temp 98.9 F (37.2 C)     Temp Source Oral     SpO2 98 %     Weight      Height      Head Circumference      Peak Flow      Pain Score 0     Pain Loc  Pain Edu?      Excl. in GC?    No data found.  Updated Vital Signs BP 132/82 (BP Location: Right Arm)    Pulse 77    Temp 98.9 F (37.2 C) (Oral)    Resp 14    LMP 09/26/2021 (Approximate)    SpO2 98%   Visual Acuity Right Eye Distance:   Left Eye Distance:   Bilateral Distance:    Right Eye Near:   Left Eye Near:    Bilateral Near:     Physical Exam Vitals and nursing note reviewed.  Constitutional:      Appearance: Normal appearance. She is not ill-appearing.  HENT:     Head: Atraumatic.  Eyes:     Extraocular Movements: Extraocular movements intact.     Conjunctiva/sclera: Conjunctivae normal.  Cardiovascular:     Rate and Rhythm: Normal rate and regular rhythm.     Heart sounds: Normal heart sounds.  Pulmonary:     Effort: Pulmonary effort is normal.     Breath sounds: Normal breath sounds.  Abdominal:     General: Bowel sounds are  normal. There is no distension.     Palpations: Abdomen is soft.     Tenderness: There is no abdominal tenderness. There is no right CVA tenderness, left CVA tenderness or guarding.  Genitourinary:    Comments: GU exam deferred, self swab performed Musculoskeletal:        General: Normal range of motion.     Cervical back: Normal range of motion and neck supple.  Skin:    General: Skin is warm and dry.     Comments: Flesh-colored papular firm lesion about 0.25 cm in diameter inner right elbow  Neurological:     Mental Status: She is alert and oriented to person, place, and time.  Psychiatric:        Mood and Affect: Mood normal.        Thought Content: Thought content normal.        Judgment: Judgment normal.     UC Treatments / Results  Labs (all labs ordered are listed, but only abnormal results are displayed) Labs Reviewed  POCT URINALYSIS DIP (MANUAL ENTRY) - Abnormal; Notable for the following components:      Result Value   Spec Grav, UA >=1.030 (*)    Blood, UA small (*)    All other components within normal limits  CERVICOVAGINAL ANCILLARY ONLY - Abnormal; Notable for the following components:   Bacterial Vaginitis (gardnerella) Positive (*)    All other components within normal limits  POCT URINE PREGNANCY    EKG   Radiology No results found.  Procedures Procedures (including critical care time)  Medications Ordered in UC Medications - No data to display  Initial Impression / Assessment and Plan / UC Course  I have reviewed the triage vital signs and the nursing notes.  Pertinent labs & imaging results that were available during my care of the patient were reviewed by me and considered in my medical decision making (see chart for details).     UA without evidence of urinary tract infection, vaginal swab pending.  Discussed boric acid suppositories, unscented soap and other vaginal hygiene recommendations.  We will treat based on vaginal swab results.   Urine pregnancy again negative, reassurance given regarding irregular periods and discussed to follow-up with OB/GYN if not becoming regular in the next few months.  Reassurance provided regarding the skin lesion, it is benign appearing at this time but  discussed if beginning to change or become painful to follow-up with dermatology or general surgery for discussion of further management.  Final Clinical Impressions(s) / UC Diagnoses   Final diagnoses:  Acute vaginitis  Skin lesion     Discharge Instructions      Try an unscented soap like Dove unscented to wash your private area and avoid any scented products, feminine washes or wipes, douches as these can throw off the pH and cause irritation and infections.  You can also try boric acid vaginal suppositories which are available over-the-counter daily as needed to help combat irritation, itching, discharge, odor    ED Prescriptions   None    PDMP not reviewed this encounter.   Volney American, [place] 12/06/21 1940

## 2022-02-04 ENCOUNTER — Ambulatory Visit
Admission: EM | Admit: 2022-02-04 | Discharge: 2022-02-04 | Disposition: A | Discharge status: Home / Self Care | Payer: Managed Care, Other (non HMO) | Attending: Urgent Care | Admitting: Urgent Care

## 2022-02-04 ENCOUNTER — Encounter: Payer: Self-pay | Admitting: Emergency Medicine

## 2022-02-04 ENCOUNTER — Other Ambulatory Visit: Payer: Self-pay

## 2022-02-04 DIAGNOSIS — H938X3 Other specified disorders of ear, bilateral: Secondary | ICD-10-CM | POA: Diagnosis not present

## 2022-02-04 DIAGNOSIS — J309 Allergic rhinitis, unspecified: Secondary | ICD-10-CM

## 2022-02-04 DIAGNOSIS — J3489 Other specified disorders of nose and nasal sinuses: Secondary | ICD-10-CM | POA: Diagnosis not present

## 2022-02-04 MED ORDER — AMOXICILLIN 875 MG PO TABS
875.0000 mg | ORAL_TABLET | Freq: Two times a day (BID) | ORAL | 0 refills | Status: AC
Start: 2022-02-04 — End: ?

## 2022-02-04 MED ORDER — PSEUDOEPHEDRINE HCL 60 MG PO TABS: 60.0000 mg | ORAL_TABLET | Freq: Three times a day (TID) | ORAL | 0 refills | Status: AC | PRN

## 2022-02-04 MED ORDER — MONTELUKAST SODIUM 10 MG PO TABS: 10.0000 mg | ORAL_TABLET | Freq: Every day | ORAL | 0 refills | Status: AC

## 2022-02-04 MED ORDER — PREDNISONE 50 MG PO TABS
50.0000 mg | ORAL_TABLET | Freq: Every day | ORAL | 0 refills | Status: AC
Start: 2022-02-04 — End: ?

## 2022-02-04 NOTE — ED Provider Notes (Signed)
[facility]   MRN: [id number] DOB: March 10, 1996  Subjective:   Tonya Miles is a 26 y.o. female presenting for 1 week history of persistent sinus pressure, sinus congestion, occasional cough.  Patient reports longstanding history of difficulty with her allergies.  She previously took allergy medications for a while and then stopped them on her own.  She is currently taking Xyzal.  She does have exposure to secondhand smoke through her boyfriend.  She also has 3 pet dogs that are outdoors plenty.  No ear drainage, throat pain, shortness of breath or wheezing.  Patient is not a smoker herself.  No current facility-administered medications for this encounter.  Current Outpatient Medications:    levocetirizine (XYZAL) 5 MG tablet, Take 5 mg by mouth every evening. (Patient not taking: Reported on 12/02/2021), Disp: , Rfl:    No Known Allergies  History reviewed. No pertinent past medical history.   History reviewed. No pertinent surgical history.  Family History  Problem Relation Age of Onset   Diabetes Mother    Fibromyalgia Mother     Social History   Tobacco Use   Smoking status: Never   Smokeless tobacco: Never  Vaping Use   Vaping Use: Never used  Substance Use Topics   Alcohol use: Yes    Alcohol/week: 1.0 standard drink    Types: 1 Glasses of wine per week   Drug use: Never    ROS   Objective:   Vitals: BP (!) 144/85 (BP Location: Right Arm)    Pulse 95    Temp 98.1 F (36.7 C) (Oral)    Resp 18    LMP 01/23/2022 (Exact Date)    SpO2 99%   Physical Exam Constitutional:      General: She is not in acute distress.    Appearance: Normal appearance. She is well-developed and normal weight. She is not ill-appearing, toxic-appearing or diaphoretic.  HENT:     Head: Normocephalic and atraumatic.     Right Ear: Tympanic membrane, ear canal and external ear normal. No drainage or tenderness. No middle ear effusion. There is no impacted cerumen.  Tympanic membrane is not erythematous.     Left Ear: Tympanic membrane, ear canal and external ear normal. No drainage or tenderness.  No middle ear effusion. There is no impacted cerumen. Tympanic membrane is not erythematous.     Nose: Congestion and rhinorrhea present.     Mouth/Throat:     Mouth: Mucous membranes are moist. No oral lesions.     Pharynx: No pharyngeal swelling, oropharyngeal exudate, posterior oropharyngeal erythema or uvula swelling.     Tonsils: No tonsillar exudate or tonsillar abscesses.  Eyes:     General: No scleral icterus.       Right eye: No discharge.        Left eye: No discharge.     Extraocular Movements: Extraocular movements intact.     Right eye: Normal extraocular motion.     Left eye: Normal extraocular motion.     Conjunctiva/sclera: Conjunctivae normal.  Cardiovascular:     Rate and Rhythm: Normal rate.     Heart sounds: No murmur heard.   No friction rub. No gallop.  Pulmonary:     Effort: Pulmonary effort is normal. No respiratory distress.     Breath sounds: No stridor. No wheezing, rhonchi or rales.  Chest:     Chest wall: No tenderness.  Musculoskeletal:     Cervical back: Normal range of motion and neck supple.  Lymphadenopathy:     Cervical: No cervical adenopathy.  Skin:    General: Skin is warm and dry.  Neurological:     General: No focal deficit present.     Mental Status: She is alert and oriented to person, place, and time.  Psychiatric:        Mood and Affect: Mood normal.        Behavior: Behavior normal.    Assessment and Plan :   PDMP not reviewed this encounter.  1. Sinus pressure   2. Ear pressure, bilateral   3. Chronic allergic rhinitis    Low suspicion for bacterial sinusitis.  Recommended an oral prednisone course in the context of her chronic allergic rhinitis that is minimally treated.  Emphasized need to maintain Xyzal daily.  Add Singulair.  Deferred testing given the timeline of her symptoms. Deferred  imaging given clear cardiopulmonary exam, hemodynamically stable vital signs.  What she has done with the prednisone course, use pseudoephedrine as needed for breakthrough symptoms. Counseled patient on potential for adverse effects with medications prescribed/recommended today, ER and return-to-clinic precautions discussed, patient verbalized understanding.    Jaynee Eagles, PA-C 02/04/22 1152

## 2022-02-04 NOTE — ED Triage Notes (Signed)
Sinus pressure and ears feel stopped up x 1 week.  Has been taking levocetirazine  to treat symptoms

## 2022-02-12 ENCOUNTER — Encounter: Payer: Managed Care, Other (non HMO) | Admitting: Women's Health

## 2022-02-17 DIAGNOSIS — B349 Viral infection, unspecified: Secondary | ICD-10-CM | POA: Insufficient documentation

## 2022-02-17 DIAGNOSIS — J0110 Acute frontal sinusitis, unspecified: Secondary | ICD-10-CM | POA: Diagnosis not present

## 2022-02-17 DIAGNOSIS — R0981 Nasal congestion: Secondary | ICD-10-CM | POA: Diagnosis present

## 2022-02-17 DIAGNOSIS — K30 Functional dyspepsia: Secondary | ICD-10-CM | POA: Insufficient documentation

## 2022-02-17 DIAGNOSIS — Z20822 Contact with and (suspected) exposure to covid-19: Secondary | ICD-10-CM | POA: Insufficient documentation

## 2022-02-18 ENCOUNTER — Emergency Department (HOSPITAL_COMMUNITY)
Admission: EM | Admit: 2022-02-18 | Discharge: 2022-02-18 | Disposition: A | Discharge status: Home / Self Care | Payer: Managed Care, Other (non HMO) | Attending: Emergency Medicine | Admitting: Emergency Medicine

## 2022-02-18 ENCOUNTER — Emergency Department (HOSPITAL_COMMUNITY): Payer: Managed Care, Other (non HMO)

## 2022-02-18 ENCOUNTER — Other Ambulatory Visit: Payer: Self-pay

## 2022-02-18 DIAGNOSIS — K30 Functional dyspepsia: Secondary | ICD-10-CM

## 2022-02-18 DIAGNOSIS — J321 Chronic frontal sinusitis: Secondary | ICD-10-CM

## 2022-02-18 DIAGNOSIS — B349 Viral infection, unspecified: Secondary | ICD-10-CM

## 2022-02-18 LAB — RESP PANEL BY RT-PCR (FLU A&B, COVID) ARPGX2
Influenza A by PCR: NEGATIVE
Influenza B by PCR: NEGATIVE
SARS Coronavirus 2 by RT PCR: NEGATIVE

## 2022-02-18 LAB — POC URINE PREG, ED: Preg Test, Ur: NEGATIVE

## 2022-02-18 MED ORDER — PSEUDOEPHEDRINE HCL 60 MG PO TABS: 60.0000 mg | ORAL_TABLET | Freq: Three times a day (TID) | ORAL | 0 refills | Status: AC | PRN

## 2022-02-18 MED ORDER — ACETAMINOPHEN 325 MG PO TABS
650.0000 mg | ORAL_TABLET | Freq: Once | ORAL | Status: AC
Start: 2022-02-18 — End: 2022-02-18
  Administered 2022-02-18: 650 mg via ORAL
  Filled 2022-02-18: qty 2

## 2022-02-18 MED ORDER — FLUTICASONE PROPIONATE 50 MCG/ACT NA SUSP: 1.0000 | Freq: Every day | NASAL | 0 refills | Status: AC

## 2022-02-18 MED ORDER — ALUM & MAG HYDROXIDE-SIMETH 200-200-20 MG/5ML PO SUSP
30.0000 mL | Freq: Once | ORAL | Status: AC
  Administered 2022-02-18: 30 mL via ORAL
  Filled 2022-02-18: qty 30

## 2022-02-18 MED ORDER — SALINE SPRAY 0.65 % NA SOLN: 1.0000 | NASAL | 0 refills | Status: AC | PRN

## 2022-02-18 MED ORDER — IBUPROFEN 800 MG PO TABS
800.0000 mg | ORAL_TABLET | Freq: Once | ORAL | Status: AC
Start: 2022-02-18 — End: 2022-02-18
  Administered 2022-02-18: 800 mg via ORAL
  Filled 2022-02-18: qty 1

## 2022-02-18 MED ORDER — LIDOCAINE VISCOUS HCL 2 % MT SOLN
15.0000 mL | Freq: Once | OROMUCOSAL | Status: AC
  Administered 2022-02-18: 15 mL via ORAL
  Filled 2022-02-18: qty 15

## 2022-02-18 NOTE — Discharge Instructions (Addendum)
It was a pleasure caring for you today in the emergency department. ? ?Your COVID-19 and flu test was negative, chest x-ray without evidence of pneumonia or acute abnormalities.  Pregnancy test was negative. ? ?Please return to the emergency department for any worsening or worrisome symptoms. ? ?

## 2022-02-18 NOTE — ED Triage Notes (Signed)
Pov from home.. pt has multiple complaints tonight. Says that she has been having sinus problems, ear fullness, headaches, aching, and then indigestion. All of this has been going on appx 3-4 weeks. Has already been to urgent care on 2/19 ?

## 2022-02-18 NOTE — ED Provider Notes (Signed)
[facility] EMERGENCY DEPARTMENT Provider Note   CSN: [id number] Arrival date & time: 02/17/22  2342     History  Chief Complaint  Patient presents with   Multiple Complaints    Jamirah Zelaya Wilcock is a 26 y.o. female.  This is a 27 y.o. female without significant medical history as below who presents to the ED with complaint of multiple complaints.  She has been having sinus congestion, rhinorrhea, postnasal drip for the past 3 weeks, mild ear pressure on the right.  Intermittent indigestion.  More sleepy over the past few days.  She was seen urgent care last month for this complaint, started on steroids and oral antibiotics for presumed sinus infection.  She did not complete the steroids because she thought that she was having a reaction to steroids, she took steroids for around 3 days.  She has been taking her home allergy medications.  She has been having some mild indigestion past few days, no indigestion currently.  No fevers, chills, nausea or vomiting.  No change in bowel or bladder function, no abdominal pain, chest pain.  No abdominal pain, no diet changes recently.   No past medical history on file.  No past surgical history on file.    The history is provided by the patient. No language interpreter was used.      Home Medications Prior to Admission medications   Medication Sig Start Date End Date Taking? Authorizing Provider  fluticasone (FLONASE) 50 MCG/ACT nasal spray Place 1 spray into both nostrils daily for 7 days. 02/18/22 02/25/22 Yes Sloan Leiter, DO  pseudoephedrine (SUDAFED) 60 MG tablet Take 1 tablet (60 mg total) by mouth every 8 (eight) hours as needed for congestion. 02/18/22  Yes Tanda Rockers A, DO  sodium chloride (OCEAN) 0.65 % SOLN nasal spray Place 1 spray into both nostrils as needed for congestion. 02/18/22  Yes Tanda Rockers A, DO  amoxicillin (AMOXIL) 875 MG tablet Take 1 tablet (875 mg total) by mouth 2 (two) times daily. 02/04/22   Wallis Bamberg, PA-C   levocetirizine (XYZAL) 5 MG tablet Take 5 mg by mouth every evening. Patient not taking: Reported on 12/02/2021    [provider]  montelukast (SINGULAIR) 10 MG tablet Take 1 tablet (10 mg total) by mouth at bedtime. 02/04/22   Wallis Bamberg, PA-C  predniSONE (DELTASONE) 50 MG tablet Take 1 tablet (50 mg total) by mouth daily with breakfast. 02/04/22   Wallis Bamberg, PA-C  pseudoephedrine (SUDAFED) 60 MG tablet Take 1 tablet (60 mg total) by mouth every 8 (eight) hours as needed for congestion. 02/04/22   Wallis Bamberg, PA-C      Allergies    Patient has no known allergies.    Review of Systems   Review of Systems  Constitutional:  Negative for chills and fever.  HENT:  Positive for congestion, postnasal drip, rhinorrhea and sinus pressure. Negative for facial swelling and trouble swallowing.   Eyes:  Negative for photophobia and visual disturbance.  Respiratory:  Negative for cough and shortness of breath.   Cardiovascular:  Negative for chest pain and palpitations.  Gastrointestinal:  Negative for abdominal pain, nausea and vomiting.       Indigestion  Endocrine: Negative for polydipsia and polyuria.  Genitourinary:  Negative for difficulty urinating and hematuria.  Musculoskeletal:  Negative for gait problem and joint swelling.  Skin:  Negative for pallor and rash.  Neurological:  Negative for syncope and headaches.  Psychiatric/Behavioral:  Negative for agitation and confusion.  Physical Exam Updated Vital Signs BP 123/70    Pulse 95    Temp 98 F (36.7 C)    Resp 16    Ht 5\' 2"  (1.575 m)    Wt 68 kg    LMP 01/23/2022 (Exact Date)    SpO2 99%    BMI 27.44 kg/m  Physical Exam Vitals and nursing note reviewed.  Constitutional:      General: She is not in acute distress.    Appearance: Normal appearance.  HENT:     Head: Normocephalic and atraumatic. No raccoon eyes, Battle's sign, right periorbital erythema or left periorbital erythema.     Right Ear: External ear normal.  No middle ear effusion. No mastoid tenderness. Tympanic membrane is scarred.     Left Ear: Tympanic membrane and external ear normal.  No middle ear effusion. No mastoid tenderness.     Nose: Nose normal.     Mouth/Throat:     Lips: Pink.     Mouth: Mucous membranes are moist.     Tongue: Tongue does not deviate from midline.     Pharynx: Oropharynx is clear. Uvula midline. No oropharyngeal exudate or uvula swelling.  Eyes:     General: No scleral icterus.       Right eye: No discharge.        Left eye: No discharge.  Cardiovascular:     Rate and Rhythm: Normal rate and regular rhythm.     Pulses: Normal pulses.     Heart sounds: Normal heart sounds.  Pulmonary:     Effort: Pulmonary effort is normal. No respiratory distress.     Breath sounds: Normal breath sounds.  Abdominal:     General: Abdomen is flat.     Tenderness: There is no abdominal tenderness.  Musculoskeletal:        General: Normal range of motion.     Cervical back: Full passive range of motion without pain and normal range of motion.     Right lower leg: No edema.     Left lower leg: No edema.  Skin:    General: Skin is warm and dry.     Capillary Refill: Capillary refill takes less than 2 seconds.  Neurological:     Mental Status: She is alert and oriented to person, place, and time.     GCS: GCS eye subscore is 4. GCS verbal subscore is 5. GCS motor subscore is 6.  Psychiatric:        Mood and Affect: Mood normal.        Behavior: Behavior normal.    ED Results / Procedures / Treatments   Labs (all labs ordered are listed, but only abnormal results are displayed) Labs Reviewed  RESP PANEL BY RT-PCR (FLU A&B, COVID) [id number]  POC URINE PREG, ED    EKG None  Radiology DG Chest Portable 1 View  Result Date: 02/18/2022 CLINICAL DATA:  Difficulty breathing EXAM: PORTABLE CHEST 1 VIEW COMPARISON:  09/05/2018 FINDINGS: The heart size and mediastinal contours are within normal limits. Both lungs are clear.  The visualized skeletal structures are unremarkable. IMPRESSION: No active disease. Electronically Signed   By: 09/07/2018 M.D.   On: 02/18/2022 02:35    Procedures Procedures    Medications Ordered in ED Medications  alum & mag hydroxide-simeth (MAALOX/MYLANTA) 200-200-20 MG/5ML suspension 30 mL (30 mLs Oral Given 02/18/22 0247)    And  lidocaine (XYLOCAINE) 2 % viscous mouth solution 15 mL (15 mLs Oral Given 02/18/22 0247)  ibuprofen (ADVIL) tablet 800 mg (800 mg Oral Given 02/18/22 0436)  acetaminophen (TYLENOL) tablet 650 mg (650 mg Oral Given 02/18/22 0436)    ED Course/ Medical Decision Making/ A&P                           Medical Decision Making Amount and/or Complexity of Data Reviewed Labs: ordered. Radiology: ordered.  Risk OTC drugs. Prescription drug management.    CC: Sinus pressure, congestion, indigestion  This patient presents to the Emergency Department for the above complaint. This involves an extensive number of treatment options and is a complaint that carries with it a high risk of complications and morbidity. Vital signs were reviewed. Serious etiologies considered.  Record review:  Previous records obtained and reviewed   Medical and surgical history as noted above.   Work up as above, notable for:  Labs & imaging results that were available during my care of the patient were visualized by me and considered in my medical decision making.   I ordered imaging studies which included x-ray and I visualized the imaging and I agree with radiologist interpretation.  No acute process noted  Cardiac monitoring reviewed and interpreted personally which shows normal sinus rhythm  Management: Give Maalox  Reassessment:  Patient is feeling much better, indigestion sensation is resolved.  She has no chest pain.  No dyspnea.  Today likely secondary to sinusitis.  Low suspicion for bacterial sinusitis. Supportive care discussed at home.  Outpatient follow-up.  The  patient improved significantly and was discharged in stable condition. Detailed discussions were had with the patient regarding current findings, and need for close f/u with PCP or on call doctor. The patient has been instructed to return immediately if the symptoms worsen in any way for re-evaluation. Patient verbalized understanding and is in agreement with current care plan. All questions answered prior to discharge.         This chart was dictated using voice recognition software.  Despite best efforts to proofread,  errors can occur which can change the documentation meaning.         Final Clinical Impression(s) / ED Diagnoses Final diagnoses:  Viral syndrome  Frontal sinusitis, unspecified chronicity  Indigestion    Rx / DC Orders ED Discharge Orders          Ordered    fluticasone (FLONASE) 50 MCG/ACT nasal spray  Daily        02/18/22 0408    pseudoephedrine (SUDAFED) 60 MG tablet  Every 8 hours PRN        02/18/22 0408    sodium chloride (OCEAN) 0.65 % SOLN nasal spray  As needed        02/18/22 0408              Sloan Leiter, DO 02/19/22 [phone]

## 2022-07-25 ENCOUNTER — Other Ambulatory Visit (HOSPITAL_COMMUNITY): Payer: Self-pay | Admitting: Family Medicine

## 2022-07-25 ENCOUNTER — Other Ambulatory Visit: Payer: Self-pay | Admitting: Family Medicine

## 2022-07-25 DIAGNOSIS — N915 Oligomenorrhea, unspecified: Secondary | ICD-10-CM

## 2022-09-05 ENCOUNTER — Other Ambulatory Visit (HOSPITAL_COMMUNITY): Payer: Self-pay | Admitting: Family Medicine

## 2022-09-05 ENCOUNTER — Ambulatory Visit
Admission: RE | Admit: 2022-09-05 | Discharge: 2022-09-05 | Disposition: A | Discharge status: Home / Self Care | Payer: Managed Care, Other (non HMO) | Source: Ambulatory Visit | Attending: Family Medicine | Admitting: Family Medicine

## 2022-09-05 DIAGNOSIS — N915 Oligomenorrhea, unspecified: Secondary | ICD-10-CM

## 2023-01-27 ENCOUNTER — Ambulatory Visit
Admission: EM | Admit: 2023-01-27 | Discharge: 2023-01-27 | Disposition: A | Discharge status: Home / Self Care | Payer: Managed Care, Other (non HMO) | Attending: Family Medicine | Admitting: Family Medicine

## 2023-01-27 DIAGNOSIS — J0110 Acute frontal sinusitis, unspecified: Secondary | ICD-10-CM

## 2023-01-27 MED ORDER — AMOXICILLIN-POT CLAVULANATE 875-125 MG PO TABS: 1.0000 | ORAL_TABLET | Freq: Two times a day (BID) | ORAL | 0 refills | Status: DC

## 2023-01-27 NOTE — ED Triage Notes (Signed)
Pt reports sinus pressure behind her eyes, ears feel stopped up, headache and post nasal drip x 2 weeks

## 2023-01-27 NOTE — ED Provider Notes (Signed)
[facility]    CSN: [id number] Arrival date & time: 01/27/23  0908      History   Chief Complaint No chief complaint on file.   HPI Tonya Miles is a 27 y.o. female.   Patient presenting today with 2-week history of sinus pain and pressure, nasal congestion, bilateral ear pressure, sinus headache.  Denies fever, chills, cough, chest pain, shortness of breath, abdominal pain, nausea vomiting or diarrhea.  Has been diligent with her allergy regimen, nasal sprays and has been using nasal saline and Sudafed additionally with minimal relief.    History reviewed. No pertinent past medical history.  There are no problems to display for this patient.   History reviewed. No pertinent surgical history.  OB History   No obstetric history on file.      Home Medications    Prior to Admission medications   Medication Sig Start Date End Date Taking? Authorizing Provider  amoxicillin-clavulanate (AUGMENTIN) 875-125 MG tablet Take 1 tablet by mouth every 12 (twelve) hours. 01/27/23  Yes Volney American, PA-C  amoxicillin (AMOXIL) 875 MG tablet Take 1 tablet (875 mg total) by mouth 2 (two) times daily. 02/04/22   Jaynee Eagles, PA-C  fluticasone (FLONASE) 50 MCG/ACT nasal spray Place 1 spray into both nostrils daily for 7 days. 02/18/22 02/25/22  Jeanell Sparrow, DO  levocetirizine (XYZAL) 5 MG tablet Take 5 mg by mouth every evening. Patient not taking: Reported on 12/02/2021    [provider]  montelukast (SINGULAIR) 10 MG tablet Take 1 tablet (10 mg total) by mouth at bedtime. 02/04/22   Jaynee Eagles, PA-C  predniSONE (DELTASONE) 50 MG tablet Take 1 tablet (50 mg total) by mouth daily with breakfast. 02/04/22   Jaynee Eagles, PA-C  pseudoephedrine (SUDAFED) 60 MG tablet Take 1 tablet (60 mg total) by mouth every 8 (eight) hours as needed for congestion. 02/04/22   Jaynee Eagles, PA-C  pseudoephedrine (SUDAFED) 60 MG tablet Take 1 tablet (60 mg total) by mouth every 8  (eight) hours as needed for congestion. 02/18/22   Jeanell Sparrow, DO  sodium chloride (OCEAN) 0.65 % SOLN nasal spray Place 1 spray into both nostrils as needed for congestion. 02/18/22   Jeanell Sparrow, DO    Family History Family History  Problem Relation Age of Onset   Diabetes Mother    Fibromyalgia Mother     Social History Social History   Tobacco Use   Smoking status: Never   Smokeless tobacco: Never  Vaping Use   Vaping Use: Never used  Substance Use Topics   Alcohol use: Yes    Alcohol/week: 1.0 standard drink of alcohol    Types: 1 Glasses of wine per week   Drug use: Never     Allergies   Prednisone   Review of Systems Review of Systems Per HPI  Physical Exam Triage Vital Signs ED Triage Vitals  Enc Vitals Group     BP 01/27/23 0949 (!) 131/92     Pulse Rate 01/27/23 0949 75     Resp 01/27/23 0949 20     Temp 01/27/23 0949 98.7 F (37.1 C)     Temp Source 01/27/23 0949 Oral     SpO2 01/27/23 0949 98 %     Weight --      Height --      Head Circumference --      Peak Flow --      Pain Score 01/27/23 0952 4  Pain Loc --      Pain Edu? --      Excl. in [place]? --    No data found.  Updated Vital Signs BP (!) 131/92 (BP Location: Right Arm)   Pulse 75   Temp 98.7 F (37.1 C) (Oral)   Resp 20   LMP 12/11/2022 (Approximate)   SpO2 98%   Visual Acuity Right Eye Distance:   Left Eye Distance:   Bilateral Distance:    Right Eye Near:   Left Eye Near:    Bilateral Near:     Physical Exam Vitals and nursing note reviewed.  Constitutional:      Appearance: Normal appearance.  HENT:     Head: Atraumatic.     Right Ear: Tympanic membrane and external ear normal.     Left Ear: Tympanic membrane and external ear normal.     Nose: Congestion present.     Mouth/Throat:     Mouth: Mucous membranes are moist.     Pharynx: Posterior oropharyngeal erythema present.  Eyes:     Extraocular Movements: Extraocular movements intact.      Conjunctiva/sclera: Conjunctivae normal.  Cardiovascular:     Rate and Rhythm: Normal rate and regular rhythm.     Heart sounds: Normal heart sounds.  Pulmonary:     Effort: Pulmonary effort is normal.     Breath sounds: Normal breath sounds. No wheezing or rales.  Musculoskeletal:        General: Normal range of motion.     Cervical back: Normal range of motion and neck supple.  Skin:    General: Skin is warm and dry.  Neurological:     Mental Status: She is alert and oriented to person, place, and time.     Motor: No weakness.     Gait: Gait normal.  Psychiatric:        Mood and Affect: Mood normal.        Thought Content: Thought content normal.      UC Treatments / Results  Labs (all labs ordered are listed, but only abnormal results are displayed) Labs Reviewed - No data to display  EKG   Radiology No results found.  Procedures Procedures (including critical care time)  Medications Ordered in UC Medications - No data to display  Initial Impression / Assessment and Plan / UC Course  I have reviewed the triage vital signs and the nursing notes.  Pertinent labs & imaging results that were available during my care of the patient were reviewed by me and considered in my medical decision making (see chart for details).     Vital signs reassuring today, given duration worsening course will cover with Augmentin for sinus infection and continue allergy regimen, sinus rinses, supportive over-the-counter medications and home care.  Return for worsening symptoms.  Final Clinical Impressions(s) / UC Diagnoses   Final diagnoses:  Acute non-recurrent frontal sinusitis   Discharge Instructions   None    ED Prescriptions     Medication Sig Dispense Auth. Provider   amoxicillin-clavulanate (AUGMENTIN) 875-125 MG tablet Take 1 tablet by mouth every 12 (twelve) hours. 14 tablet Volney American, [place]      PDMP not reviewed this encounter.   Volney American, [place] 01/27/23 1113

## 2023-02-20 ENCOUNTER — Ambulatory Visit
Admission: EM | Admit: 2023-02-20 | Discharge: 2023-02-20 | Disposition: A | Discharge status: Home / Self Care | Payer: Managed Care, Other (non HMO) | Attending: Nurse Practitioner | Admitting: Nurse Practitioner

## 2023-02-20 DIAGNOSIS — J069 Acute upper respiratory infection, unspecified: Secondary | ICD-10-CM

## 2023-02-20 DIAGNOSIS — R490 Dysphonia: Secondary | ICD-10-CM

## 2023-02-20 LAB — POCT RAPID STREP A (OFFICE): Rapid Strep A Screen: NEGATIVE

## 2023-02-20 LAB — POCT INFLUENZA A/B
Influenza A, POC: NEGATIVE
Influenza B, POC: NEGATIVE

## 2023-02-20 LAB — POCT URINE PREGNANCY: Preg Test, Ur: NEGATIVE

## 2023-02-20 MED ORDER — PROMETHAZINE-DM 6.25-15 MG/5ML PO SYRP: 5.0000 mL | ORAL_SOLUTION | Freq: Four times a day (QID) | ORAL | 0 refills | Status: AC | PRN

## 2023-02-20 MED ORDER — IPRATROPIUM BROMIDE 0.03 % NA SOLN: 2.0000 | Freq: Two times a day (BID) | NASAL | 0 refills | Status: AC

## 2023-02-20 NOTE — ED Triage Notes (Signed)
Pt report cough, fever 102.6 F x 4 days; sore throat x 1 day. Sudafed, Nyquil gives some relief.

## 2023-02-20 NOTE — Discharge Instructions (Addendum)
The rapid strep test and influenza test were negative.  A throat culture has been ordered.  You will be contacted if the throat culture results are positive. Take medication as prescribed. Increase fluids and allow for plenty of rest. Recommend Tylenol or ibuprofen as needed for pain, fever, or general discomfort. Warm salt water gargles 3-4 times daily to help with throat pain or discomfort. Rest your voice to help with hoarseness. Recommend using a humidifier at bedtime and sleeping elevated on pillows while cough symptoms persist. A viral illness may last from 7 to 14 days.  If your symptoms suddenly worsen before that time, or extend beyond that time, please follow-up in this clinic or with your primary care physician for further evaluation. Follow-up as needed.

## 2023-02-20 NOTE — ED Provider Notes (Signed)
[facility]    CSN: [id number] Arrival date & time: 02/20/23  0931      History   Chief Complaint Chief Complaint  Patient presents with   Fever   Sore Throat         HPI Tonya Miles is a 27 y.o. female.   The history is provided by the patient.   The patient presents for complaints of fever, chills, sore throat, and cough that started over the past several days.  Tonya Miles states over the past 24 hours, Tonya Miles has developed hoarseness, and cough became worse to the point where it was causing her to vomit.  Tonya Miles denies headache, ear pain, ear drainage, wheezing, shortness of breath, difficulty breathing, or GI symptoms.  Patient reports Tonya Miles has been using NyQuil and Sudafed, which gives her some relief.  Tonya Miles reports Tonya Miles just completed antibiotics for a sinus infection.  History reviewed. No pertinent past medical history.  There are no problems to display for this patient.   History reviewed. No pertinent surgical history.  OB History   No obstetric history on file.      Home Medications    Prior to Admission medications   Medication Sig Start Date End Date Taking? Authorizing Provider  ipratropium (ATROVENT) 0.03 % nasal spray Place 2 sprays into both nostrils every 12 (twelve) hours. 02/20/23  Yes Bergen Magner-Warren, Alda Lea, NP  promethazine-dextromethorphan (PROMETHAZINE-DM) 6.25-15 MG/5ML syrup Take 5 mLs by mouth 4 (four) times daily as needed for cough. 02/20/23  Yes Vane Yapp-Warren, Alda Lea, NP  Pseudoeph-Doxylamine-DM-APAP (NYQUIL PO) Take by mouth.   Yes [provider]  amoxicillin (AMOXIL) 875 MG tablet Take 1 tablet (875 mg total) by mouth 2 (two) times daily. 02/04/22   Jaynee Eagles, PA-C  amoxicillin-clavulanate (AUGMENTIN) 875-125 MG tablet Take 1 tablet by mouth every 12 (twelve) hours. 01/27/23   Volney American, PA-C  fluticasone [facility]) 50 MCG/ACT nasal spray Place 1 spray into both nostrils daily for 7 days. 02/18/22 02/25/22  Jeanell Sparrow, DO  levocetirizine (XYZAL) 5 MG tablet Take 5 mg by mouth every evening. Patient not taking: Reported on 12/02/2021    [provider]  montelukast (SINGULAIR) 10 MG tablet Take 1 tablet (10 mg total) by mouth at bedtime. 02/04/22   Jaynee Eagles, PA-C  predniSONE (DELTASONE) 50 MG tablet Take 1 tablet (50 mg total) by mouth daily with breakfast. 02/04/22   Jaynee Eagles, PA-C  pseudoephedrine (SUDAFED) 60 MG tablet Take 1 tablet (60 mg total) by mouth every 8 (eight) hours as needed for congestion. 02/04/22   Jaynee Eagles, PA-C  pseudoephedrine (SUDAFED) 60 MG tablet Take 1 tablet (60 mg total) by mouth every 8 (eight) hours as needed for congestion. 02/18/22   Jeanell Sparrow, DO  sodium chloride (OCEAN) 0.65 % SOLN nasal spray Place 1 spray into both nostrils as needed for congestion. 02/18/22   Jeanell Sparrow, DO    Family History Family History  Problem Relation Age of Onset   Diabetes Mother    Fibromyalgia Mother     Social History Social History   Tobacco Use   Smoking status: Never   Smokeless tobacco: Never  Vaping Use   Vaping Use: Never used  Substance Use Topics   Alcohol use: Yes    Alcohol/week: 1.0 standard drink of alcohol    Types: 1 Glasses of wine per week   Drug use: Never     Allergies   Prednisone   Review of  Systems Review of Systems Per HPI  Physical Exam Triage Vital Signs ED Triage Vitals  Enc Vitals Group     BP 02/20/23 0942 (!) 146/82     Pulse Rate 02/20/23 0942 99     Resp 02/20/23 0942 16     Temp 02/20/23 0942 99 F (37.2 C)     Temp Source 02/20/23 0942 Oral     SpO2 02/20/23 0942 97 %     Weight --      Height --      Head Circumference --      Peak Flow --      Pain Score 02/20/23 0940 4     Pain Loc --      Pain Edu? --      Excl. in [place]? --    No data found.  Updated Vital Signs BP (!) 146/82 (BP Location: Right Arm)   Pulse 99   Temp 99 F (37.2 C) (Oral)   Resp 16   LMP  (Within Months) Comment:  December 2023  SpO2 97%   Visual Acuity Right Eye Distance:   Left Eye Distance:   Bilateral Distance:    Right Eye Near:   Left Eye Near:    Bilateral Near:     Physical Exam Vitals and nursing note reviewed.  Constitutional:      General: Tonya Miles is not in acute distress.    Appearance: Tonya Miles is well-developed.  HENT:     Head: Normocephalic.     Right Ear: Tympanic membrane and ear canal normal.     Left Ear: Tympanic membrane and ear canal normal.     Nose: Congestion present. No rhinorrhea.     Mouth/Throat:     Pharynx: Pharyngeal swelling and posterior oropharyngeal erythema present. No uvula swelling.  Eyes:     Conjunctiva/sclera: Conjunctivae normal.     Pupils: Pupils are equal, round, and reactive to light.  Cardiovascular:     Rate and Rhythm: Regular rhythm.     Heart sounds: Normal heart sounds.  Pulmonary:     Effort: Pulmonary effort is normal.     Breath sounds: Normal breath sounds.  Abdominal:     General: Bowel sounds are normal.     Palpations: Abdomen is soft.     Tenderness: There is no abdominal tenderness.  Musculoskeletal:     Cervical back: Normal range of motion.  Lymphadenopathy:     Cervical: No cervical adenopathy.  Skin:    General: Skin is warm and dry.  Neurological:     General: No focal deficit present.     Mental Status: Tonya Miles is alert and oriented to person, place, and time.  Psychiatric:        Mood and Affect: Mood normal.        Behavior: Behavior normal.      UC Treatments / Results  Labs (all labs ordered are listed, but only abnormal results are displayed) Labs Reviewed  CULTURE, GROUP A STREP [facility])  POCT URINE PREGNANCY  POCT RAPID STREP A (OFFICE)  POCT INFLUENZA A/B    EKG   Radiology No results found.  Procedures Procedures (including critical care time)  Medications Ordered in UC Medications - No data to display  Initial Impression / Assessment and Plan / UC Course  I have reviewed the triage vital  signs and the nursing notes.  Pertinent labs & imaging results that were available during my care of the patient were reviewed by me and considered in  my medical decision making (see chart for details).  The patient is well-appearing, Tonya Miles is in no acute distress, vital signs are stable.  Suspect a viral upper respiratory infection.  Rapid strep test and influenza test were negative.  Patient took a home COVID test which was also negative.  Patient recently completed antibiotics for acute sinusitis.  Will treat patient symptomatically with Promethazine DM for her cough, and Atrovent 0.03% nasal spray for her nasal congestion and runny nose.  Supportive care recommendations were provided to the patient along with indications of when follow-up may be necessary.  Patient verbalizes understanding, and is in agreement with this plan of care.  All questions were answered.  Patient stable for discharge.  Work note was provided.   Final Clinical Impressions(s) / UC Diagnoses   Final diagnoses:  Viral upper respiratory tract infection with cough  Hoarseness of voice     Discharge Instructions      The rapid strep test and influenza test were negative.  A throat culture has been ordered.  You will be contacted if the throat culture results are positive. Take medication as prescribed. Increase fluids and allow for plenty of rest. Recommend Tylenol or ibuprofen as needed for pain, fever, or general discomfort. Warm salt water gargles 3-4 times daily to help with throat pain or discomfort. Rest your voice to help with hoarseness. Recommend using a humidifier at bedtime and sleeping elevated on pillows while cough symptoms persist. A viral illness may last from 7 to 14 days.  If your symptoms suddenly worsen before that time, or extend beyond that time, please follow-up in this clinic or with your primary care physician for further evaluation. Follow-up as needed.     ED Prescriptions      Medication Sig Dispense Auth. Provider   promethazine-dextromethorphan (PROMETHAZINE-DM) 6.25-15 MG/5ML syrup Take 5 mLs by mouth 4 (four) times daily as needed for cough. 118 mL Laterrica Libman-Warren, Alda Lea, NP   ipratropium (ATROVENT) 0.03 % nasal spray Place 2 sprays into both nostrils every 12 (twelve) hours. 30 mL Ferrell Flam-Warren, Alda Lea, NP      PDMP not reviewed this encounter.   Tish Men, NP 02/20/23 1146

## 2023-02-23 LAB — CULTURE, GROUP A STREP (THRC)

## 2023-03-24 ENCOUNTER — Ambulatory Visit
Admission: EM | Admit: 2023-03-24 | Discharge: 2023-03-24 | Disposition: A | Discharge status: Home / Self Care | Payer: Managed Care, Other (non HMO) | Attending: Emergency Medicine | Admitting: Emergency Medicine

## 2023-03-24 DIAGNOSIS — H5789 Other specified disorders of eye and adnexa: Secondary | ICD-10-CM | POA: Diagnosis not present

## 2023-03-24 MED ORDER — PREDNISOLONE ACETATE 1 % OP SUSP: 1.0000 [drp] | Freq: Four times a day (QID) | OPHTHALMIC | 0 refills | Status: AC

## 2023-03-24 MED ORDER — AMOXICILLIN-POT CLAVULANATE 875-125 MG PO TABS: 1.0000 | ORAL_TABLET | Freq: Two times a day (BID) | ORAL | 0 refills | Status: AC

## 2023-03-24 NOTE — ED Triage Notes (Signed)
Pt reports her right eye is swollen almost all the way shut.When she wakes up that is when it is the most swollen. Throughout the day it eases up but it still slightly swollen x 5 days  Denies getting bit by something.   Was prescribed erythromycin ointment and took some naphcon A but no relief.

## 2023-03-24 NOTE — Discharge Instructions (Addendum)
Today you were evaluated for your right eye swelling, as erythromycin in antihistamine eyedrops have been ineffective will attempt additional medications  Place 1 drop of steroid eye solution into the right eye every 6 hours for the next 3 days, if no improvement seen by this point please notify your eye doctor to get in person evaluation  Use of Augmentin every morning and every evening for 7 days, this is providing coverage for infection to the skin surrounding the eye  May use warm to cool compresses to the eye for general comfort and to help with swelling  Avoid direct eye touching or rubbing to prevent contamination further spread  If your symptoms continue to persist or worsen you will need to follow-up with ophthalmology for further evaluation and management

## 2023-03-24 NOTE — ED Provider Notes (Signed)
[facility]    CSN: [id number] Arrival date & time: 03/24/23  1057      History   Chief Complaint No chief complaint on file.   HPI Tonya Miles is a 27 y.o. female.   Patient presents for evaluation of persistent right eye swelling for 5 days.  Endorses drainage occurring only in the morning.  Presence of mild pruritus.  Denies pain, visual disturbance or light sensitivity, injury or trauma.  Was evaluated in different clinical setting 5 days ago, attempted use of erythromycin ointment as well as Diflucan eyedrops with no improvement.  History of seasonal allergies, taking Xyzal daily antihistamine.  History reviewed. No pertinent past medical history.  There are no problems to display for this patient.   History reviewed. No pertinent surgical history.  OB History   No obstetric history on file.      Home Medications    Prior to Admission medications   Medication Sig Start Date End Date Taking? Authorizing Provider  amoxicillin (AMOXIL) 875 MG tablet Take 1 tablet (875 mg total) by mouth 2 (two) times daily. 02/04/22   Wallis Bamberg, PA-C  amoxicillin-clavulanate (AUGMENTIN) 875-125 MG tablet Take 1 tablet by mouth every 12 (twelve) hours. 01/27/23   Particia Nearing, PA-C  fluticasone [facility]) 50 MCG/ACT nasal spray Place 1 spray into both nostrils daily for 7 days. 02/18/22 02/25/22  Tanda Rockers A, DO  ipratropium (ATROVENT) 0.03 % nasal spray Place 2 sprays into both nostrils every 12 (twelve) hours. 02/20/23   Leath-Warren, Sadie Haber, NP  levocetirizine (XYZAL) 5 MG tablet Take 5 mg by mouth every evening. Patient not taking: Reported on 12/02/2021    [provider]  montelukast (SINGULAIR) 10 MG tablet Take 1 tablet (10 mg total) by mouth at bedtime. 02/04/22   Wallis Bamberg, PA-C  predniSONE (DELTASONE) 50 MG tablet Take 1 tablet (50 mg total) by mouth daily with breakfast. 02/04/22   Wallis Bamberg, PA-C  promethazine-dextromethorphan  (PROMETHAZINE-DM) 6.25-15 MG/5ML syrup Take 5 mLs by mouth 4 (four) times daily as needed for cough. 02/20/23   Leath-Warren, Sadie Haber, NP  Pseudoeph-Doxylamine-DM-APAP (NYQUIL PO) Take by mouth.    [provider]  pseudoephedrine (SUDAFED) 60 MG tablet Take 1 tablet (60 mg total) by mouth every 8 (eight) hours as needed for congestion. 02/04/22   Wallis Bamberg, PA-C  pseudoephedrine (SUDAFED) 60 MG tablet Take 1 tablet (60 mg total) by mouth every 8 (eight) hours as needed for congestion. 02/18/22   Sloan Leiter, DO  sodium chloride (OCEAN) 0.65 % SOLN nasal spray Place 1 spray into both nostrils as needed for congestion. 02/18/22   Sloan Leiter, DO    Family History Family History  Problem Relation Age of Onset   Diabetes Mother    Fibromyalgia Mother     Social History Social History   Tobacco Use   Smoking status: Never   Smokeless tobacco: Never  Vaping Use   Vaping Use: Never used  Substance Use Topics   Alcohol use: Yes    Alcohol/week: 1.0 standard drink of alcohol    Types: 1 Glasses of wine per week   Drug use: Never     Allergies   Prednisone   Review of Systems Review of Systems   Physical Exam Triage Vital Signs ED Triage Vitals [03/24/23 1109]  Enc Vitals Group     BP      Pulse      Resp      Temp  Temp src      SpO2      Weight      Height      Head Circumference      Peak Flow      Pain Score 0     Pain Loc      Pain Edu?      Excl. in [place]?    No data found.  Updated Vital Signs LMP 11/20/2022   Visual Acuity Right Eye Distance: 20/20 Left Eye Distance: 20/20 Bilateral Distance: 20/16  Right Eye Near:   Left Eye Near:    Bilateral Near:     Physical Exam Constitutional:      Appearance: Normal appearance.  HENT:     Head: Normocephalic.  Eyes:     Comments: Significant right periorbital swelling without erythema or tenderness, no erythema present to the right conjunctiva, no drainage present on exam, vision is  grossly intact, extraocular movements intact  Pulmonary:     Effort: Pulmonary effort is normal.  Neurological:     Mental Status: She is alert and oriented to person, place, and time. Mental status is at baseline.      UC Treatments / Results  Labs (all labs ordered are listed, but only abnormal results are displayed) Labs Reviewed - No data to display  EKG   Radiology No results found.  Procedures Procedures (including critical care time)  Medications Ordered in UC Medications - No data to display  Initial Impression / Assessment and Plan / UC Course  I have reviewed the triage vital signs and the nursing notes.  Pertinent labs & imaging results that were available during my care of the patient were reviewed by me and considered in my medical decision making (see chart for details).  Right eye swelling  Vision is intact, significant swelling to the periorbital space on exam, concern for periorbital cellulitis, symptoms could also be related to seasonal allergies, discussed this with patient, intolerance to oral prednisone therefore prednisolone eyedrops prescribed for up to 3 days as well as Augmentin for coverage for cellulitis, if no improvement seen within 72 hours patient is to notify her ophthalmologist for in person evaluation, advised against direct eye touching or rubbing, may use cool to cold compresses over the skin for additional comfort Final Clinical Impressions(s) / UC Diagnoses   Final diagnoses:  None     Discharge Instructions      Today you were evaluated for your right eye swelling, symptoms are most likely related to allergies   Will begin use of steroids as antibiotic and antihistamine have been ineffective  Take prednisone every morning with food for 5 days to reduce inflammation  May use warm to cool compresses to the eye for general comfort and to help with swelling  Avoid direct eye touching or rubbing to prevent contamination further  spread  If your symptoms continue to persist or worsen you will need to follow-up with ophthalmology for further evaluation and management    ED Prescriptions   None    PDMP not reviewed this encounter.   Valinda Hoar, NP 03/24/23 1155

## 2023-04-11 ENCOUNTER — Other Ambulatory Visit
Admission: RE | Admit: 2023-04-11 | Discharge: 2023-04-11 | Disposition: A | Discharge status: Home / Self Care | Payer: Managed Care, Other (non HMO) | Source: Ambulatory Visit | Attending: Oculoplastics Ophthalmology | Admitting: Oculoplastics Ophthalmology

## 2023-04-11 DIAGNOSIS — H0500 Unspecified acute inflammation of orbit: Secondary | ICD-10-CM | POA: Insufficient documentation

## 2023-04-11 LAB — CBC WITH DIFFERENTIAL/PLATELET
Abs Immature Granulocytes: 0.03 10*3/uL (ref 0.00–0.07)
Basophils Absolute: 0 10*3/uL (ref 0.0–0.1)
Basophils Relative: 0 %
Eosinophils Absolute: 0.1 10*3/uL (ref 0.0–0.5)
Eosinophils Relative: 1 %
HCT: 41.5 % (ref 36.0–46.0)
Hemoglobin: 13.7 g/dL (ref 12.0–15.0)
Immature Granulocytes: 0 %
Lymphocytes Relative: 18 %
Lymphs Abs: 1.8 10*3/uL (ref 0.7–4.0)
MCH: 29.7 pg (ref 26.0–34.0)
MCHC: 33 g/dL (ref 30.0–36.0)
MCV: 89.8 fL (ref 80.0–100.0)
Monocytes Absolute: 0.7 10*3/uL (ref 0.1–1.0)
Monocytes Relative: 6 %
Neutro Abs: 7.6 10*3/uL (ref 1.7–7.7)
Neutrophils Relative %: 75 %
Platelets: 539 10*3/uL — ABNORMAL HIGH (ref 150–400)
RBC: 4.62 MIL/uL (ref 3.87–5.11)
RDW: 12.4 % (ref 11.5–15.5)
WBC: 10.3 10*3/uL (ref 4.0–10.5)
nRBC: 0 % (ref 0.0–0.2)

## 2023-04-11 LAB — COMPREHENSIVE METABOLIC PANEL
ALT: 82 U/L — ABNORMAL HIGH (ref 0–44)
AST: 42 U/L — ABNORMAL HIGH (ref 15–41)
Albumin: 4.5 g/dL (ref 3.5–5.0)
Alkaline Phosphatase: 99 U/L (ref 38–126)
Anion gap: 8 (ref 5–15)
BUN: 12 mg/dL (ref 6–20)
CO2: 25 mmol/L (ref 22–32)
Calcium: 9.8 mg/dL (ref 8.9–10.3)
Chloride: 104 mmol/L (ref 98–111)
Creatinine, Ser: 0.95 mg/dL (ref 0.44–1.00)
GFR, Estimated: >60 mL/min (ref >60)
Glucose, Bld: 94 mg/dL (ref 70–99)
Potassium: 4.2 mmol/L (ref 3.5–5.1)
Sodium: 137 mmol/L (ref 135–145)
Total Bilirubin: 1 mg/dL (ref 0.3–1.2)
Total Protein: 8.5 g/dL — ABNORMAL HIGH (ref 6.5–8.1)

## 2023-04-11 LAB — SEDIMENTATION RATE: Sed Rate: 15 mm/hr (ref 0–20)

## 2023-04-11 LAB — RPR: RPR Ser Ql: NONREACTIVE

## 2023-04-12 ENCOUNTER — Other Ambulatory Visit (HOSPITAL_COMMUNITY): Payer: Self-pay | Admitting: Oculoplastics Ophthalmology

## 2023-04-12 DIAGNOSIS — H0500 Unspecified acute inflammation of orbit: Secondary | ICD-10-CM

## 2023-04-12 LAB — ANCA PROFILE
Anti-MPO Antibodies: <0.2 units (ref 0.0–0.9)
Anti-PR3 Antibodies: <0.2 units (ref 0.0–0.9)
Atypical P-ANCA titer: <1:20 {titer}
C-ANCA: <1:20 {titer}
P-ANCA: <1:20 {titer}

## 2023-04-12 LAB — ACETYLCHOLINE RECEPTOR, BINDING: Acety choline binding ab: 0.04 nmol/L (ref 0.00–0.24)

## 2023-04-12 LAB — ANA: Anti Nuclear Antibody (ANA): NEGATIVE

## 2023-04-13 LAB — RHEUMATOID FACTOR: Rheumatoid fact SerPl-aCnc: <10 IU/mL (ref <14.0)

## 2023-04-15 LAB — QUANTIFERON-TB GOLD PLUS (RQFGPL)
QuantiFERON Mitogen Value: >10 IU/mL
QuantiFERON Nil Value: 0 IU/mL
QuantiFERON TB1 Ag Value: 0 IU/mL
QuantiFERON TB2 Ag Value: 0 IU/mL

## 2023-04-15 LAB — QUANTIFERON-TB GOLD PLUS: QuantiFERON-TB Gold Plus: NEGATIVE

## 2023-04-22 ENCOUNTER — Ambulatory Visit
Admission: RE | Admit: 2023-04-22 | Discharge: 2023-04-22 | Disposition: A | Discharge status: Home / Self Care | Payer: Managed Care, Other (non HMO) | Source: Ambulatory Visit | Attending: Oculoplastics Ophthalmology | Admitting: Oculoplastics Ophthalmology

## 2023-04-22 DIAGNOSIS — H0500 Unspecified acute inflammation of orbit: Secondary | ICD-10-CM

## 2023-04-22 MED ORDER — IOHEXOL 300 MG/ML  SOLN
75.0000 mL | Freq: Once | INTRAMUSCULAR | Status: AC | PRN
  Administered 2023-04-22: 75 mL via INTRAVENOUS

## 2023-04-24 ENCOUNTER — Ambulatory Visit: Payer: Managed Care, Other (non HMO)

## 2023-11-07 IMAGING — DX DG CHEST 1V PORT
1 series · 1 of 1 positions shown · non-contrast
Comparison: 09/05/2018

CLINICAL DATA: Difficulty breathing

EXAM:
PORTABLE CHEST 1 VIEW

[chest ap]
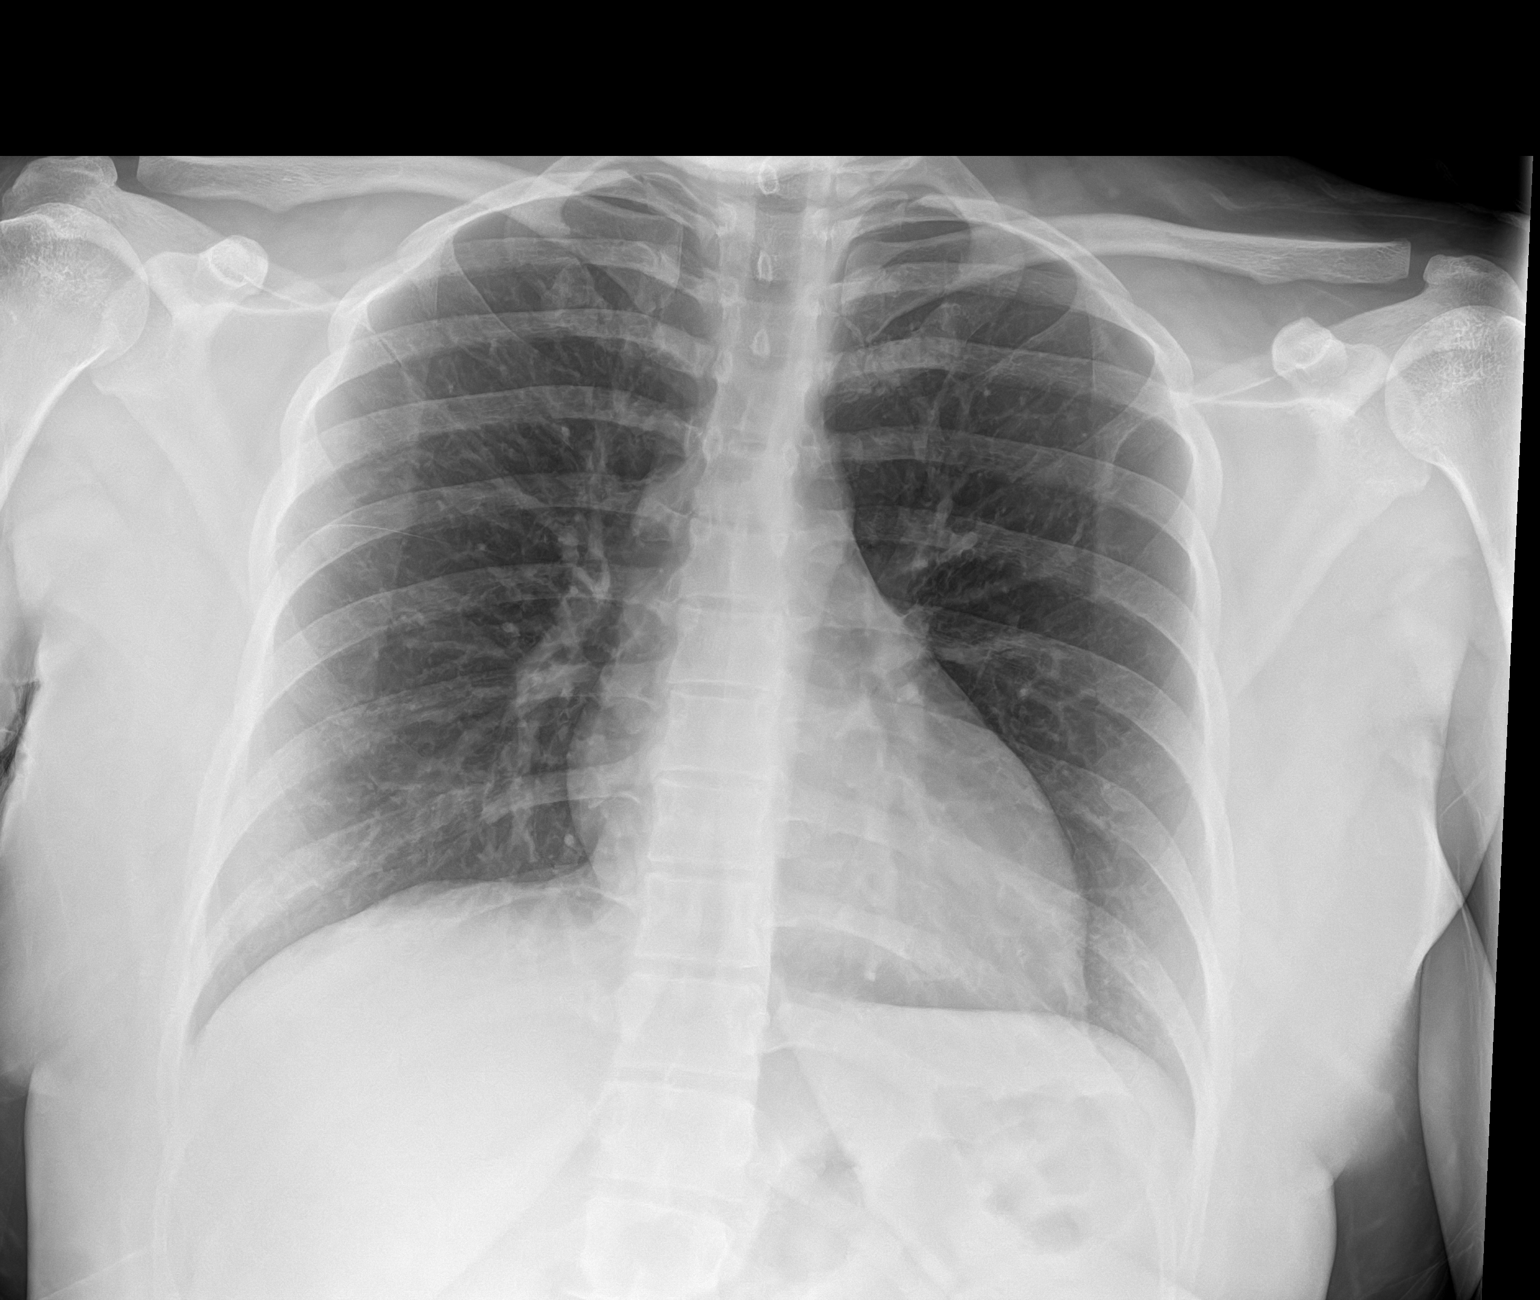

[1 of 1 positions shown; findings below may reference images not displayed]

FINDINGS: The heart size and mediastinal contours are within normal limits.
Both lungs are clear. The visualized skeletal structures are
unremarkable.
IMPRESSION: No active disease.

## 2024-03-26 ENCOUNTER — Ambulatory Visit
Admission: EM | Admit: 2024-03-26 | Discharge: 2024-03-26 | Disposition: A | Discharge status: Home / Self Care | Attending: Family Medicine | Admitting: Family Medicine

## 2024-03-26 DIAGNOSIS — N390 Urinary tract infection, site not specified: Secondary | ICD-10-CM | POA: Insufficient documentation

## 2024-03-26 DIAGNOSIS — H6121 Impacted cerumen, right ear: Secondary | ICD-10-CM | POA: Diagnosis present

## 2024-03-26 DIAGNOSIS — H65192 Other acute nonsuppurative otitis media, left ear: Secondary | ICD-10-CM | POA: Insufficient documentation

## 2024-03-26 LAB — POCT URINALYSIS DIP (MANUAL ENTRY)
Bilirubin, UA: NEGATIVE
Glucose, UA: NEGATIVE mg/dL
Ketones, POC UA: NEGATIVE mg/dL
Nitrite, UA: NEGATIVE
Protein Ur, POC: NEGATIVE mg/dL
Spec Grav, UA: 1.015 (ref 1.010–1.025)
Urobilinogen, UA: 0.2 U/dL
pH, UA: 6 (ref 5.0–8.0)

## 2024-03-26 MED ORDER — CARBAMIDE PEROXIDE 6.5 % OT SOLN: 5.0000 [drp] | Freq: Two times a day (BID) | OTIC | 0 refills | Status: AC

## 2024-03-26 MED ORDER — AZELASTINE HCL 0.1 % NA SOLN: 1.0000 | Freq: Two times a day (BID) | NASAL | 0 refills | Status: AC

## 2024-03-26 MED ORDER — CEPHALEXIN 500 MG PO CAPS: 500.0000 mg | ORAL_CAPSULE | Freq: Two times a day (BID) | ORAL | 0 refills | Status: AC

## 2024-03-26 NOTE — ED Triage Notes (Signed)
 Pt reports has had blood in urine, frequent urination, and burning with urination x 6 days

## 2024-03-26 NOTE — ED Provider Notes (Signed)
 [facility]    CSN: [id number] Arrival date & time: 03/26/24  1759      History   Chief Complaint Chief Complaint  Patient presents with   Urinary Tract Infection    HPI Tonya Miles is a 28 y.o. female.   Patient presenting today with multiple concerns.  She has had about a week of dysuria, urinary frequency, hematuria at times.  Denies fever, chills, flank pain, nausea, vomiting, vaginal symptoms.  So far trying Azo with minimal relief.  She is also having bilateral ear pain, pressure, muffled hearing.  So far not trying anything over-the-counter for this.  Does have a history of seasonal allergies on Xyzal.    History reviewed. No pertinent past medical history.  There are no active problems to display for this patient.   History reviewed. No pertinent surgical history.  OB History   No obstetric history on file.      Home Medications    Prior to Admission medications   Medication Sig Start Date End Date Taking? Authorizing Provider  azelastine (ASTELIN) 0.1 % nasal spray Place 1 spray into both nostrils 2 (two) times daily. Use in each nostril as directed 03/26/24  Yes Particia Nearing, PA-C  carbamide peroxide (DEBROX) 6.5 % OTIC solution Place 5 drops into the right ear 2 (two) times daily. 03/26/24  Yes Particia Nearing, PA-C  cephALEXin (KEFLEX) 500 MG capsule Take 1 capsule (500 mg total) by mouth 2 (two) times daily. 03/26/24  Yes Particia Nearing, PA-C  amoxicillin (AMOXIL) 875 MG tablet Take 1 tablet (875 mg total) by mouth 2 (two) times daily. 02/04/22   Wallis Bamberg, PA-C  amoxicillin-clavulanate (AUGMENTIN) 875-125 MG tablet Take 1 tablet by mouth every 12 (twelve) hours. 03/24/23   White, Elita Boone, NP  fluticasone (FLONASE) 50 MCG/ACT nasal spray Place 1 spray into both nostrils daily for 7 days. 02/18/22 02/25/22  Tanda Rockers A, DO  ipratropium (ATROVENT) 0.03 % nasal spray Place 2 sprays into both nostrils every 12 (twelve)  hours. 02/20/23   Leath-Warren, Sadie Haber, NP  levocetirizine (XYZAL) 5 MG tablet Take 5 mg by mouth every evening. Patient not taking: Reported on 12/02/2021    [provider]  montelukast (SINGULAIR) 10 MG tablet Take 1 tablet (10 mg total) by mouth at bedtime. 02/04/22   Wallis Bamberg, PA-C  prednisoLONE acetate (PRED FORTE) 1 % ophthalmic suspension Place 1 drop into the right eye 4 (four) times daily. 03/24/23   Valinda Hoar, NP  predniSONE (DELTASONE) 50 MG tablet Take 1 tablet (50 mg total) by mouth daily with breakfast. 02/04/22   Wallis Bamberg, PA-C  promethazine-dextromethorphan (PROMETHAZINE-DM) 6.25-15 MG/5ML syrup Take 5 mLs by mouth 4 (four) times daily as needed for cough. 02/20/23   Leath-Warren, Sadie Haber, NP  Pseudoeph-Doxylamine-DM-APAP (NYQUIL PO) Take by mouth.    [provider]  pseudoephedrine (SUDAFED) 60 MG tablet Take 1 tablet (60 mg total) by mouth every 8 (eight) hours as needed for congestion. 02/04/22   Wallis Bamberg, PA-C  pseudoephedrine (SUDAFED) 60 MG tablet Take 1 tablet (60 mg total) by mouth every 8 (eight) hours as needed for congestion. 02/18/22   Sloan Leiter, DO  sodium chloride (OCEAN) 0.65 % SOLN nasal spray Place 1 spray into both nostrils as needed for congestion. 02/18/22   Sloan Leiter, DO    Family History Family History  Problem Relation Age of Onset   Diabetes Mother    Fibromyalgia Mother  Social History Social History   Tobacco Use   Smoking status: Never   Smokeless tobacco: Never  Vaping Use   Vaping status: Never Used  Substance Use Topics   Alcohol use: Yes    Alcohol/week: 1.0 standard drink of alcohol    Types: 1 Glasses of wine per week   Drug use: Never     Allergies   Prednisone   Review of Systems Review of Systems PER HPI  Physical Exam Triage Vital Signs ED Triage Vitals  Encounter Vitals Group     BP 03/26/24 1832 (!) 146/93     Systolic BP Percentile --      Diastolic BP Percentile --       Pulse Rate 03/26/24 1832 (!) 113     Resp 03/26/24 1832 18     Temp 03/26/24 1832 99.6 F (37.6 C)     Temp Source 03/26/24 1832 Oral     SpO2 03/26/24 1832 97 %     Weight --      Height --      Head Circumference --      Peak Flow --      Pain Score 03/26/24 1830 0     Pain Loc --      Pain Education --      Exclude from Growth Chart --    No data found.  Updated Vital Signs BP (!) 146/93 (BP Location: Right Arm)   Pulse (!) 113   Temp 99.6 F (37.6 C) (Oral)   Resp 18   LMP 02/28/2024   SpO2 97%   Visual Acuity Right Eye Distance:   Left Eye Distance:   Bilateral Distance:    Right Eye Near:   Left Eye Near:    Bilateral Near:     Physical Exam Vitals and nursing note reviewed.  Constitutional:      Appearance: Normal appearance. She is not ill-appearing.  HENT:     Head: Atraumatic.     Right Ear: There is impacted cerumen.     Ears:     Comments: Left middle ear effusion Eyes:     Extraocular Movements: Extraocular movements intact.     Conjunctiva/sclera: Conjunctivae normal.  Cardiovascular:     Rate and Rhythm: Normal rate.  Pulmonary:     Effort: Pulmonary effort is normal.  Abdominal:     General: Bowel sounds are normal. There is no distension.     Palpations: Abdomen is soft.     Tenderness: There is no abdominal tenderness. There is no right CVA tenderness, left CVA tenderness or guarding.  Musculoskeletal:        General: Normal range of motion.     Cervical back: Normal range of motion and neck supple.  Skin:    General: Skin is warm and dry.  Neurological:     Mental Status: She is alert and oriented to person, place, and time.  Psychiatric:        Mood and Affect: Mood normal.        Thought Content: Thought content normal.        Judgment: Judgment normal.      UC Treatments / Results  Labs (all labs ordered are listed, but only abnormal results are displayed) Labs Reviewed  POCT URINALYSIS DIP (MANUAL ENTRY) - Abnormal;  Notable for the following components:      Result Value   Clarity, UA cloudy (*)    Blood, UA small (*)    Leukocytes, UA Small (  1+) (*)    All other components within normal limits  URINE CULTURE    EKG   Radiology No results found.  Procedures Procedures (including critical care time)  Medications Ordered in UC Medications - No data to display  Initial Impression / Assessment and Plan / UC Course  I have reviewed the triage vital signs and the nursing notes.  Pertinent labs & imaging results that were available during my care of the patient were reviewed by me and considered in my medical decision making (see chart for details).     Treat with Astelin for left middle ear effusion, continue Xyzal daily and discussed decongestants as needed.  Debrox drops for cerumen impaction, warm water lavage at home.  She declines lavage today in clinic.  Treat with Keflex for suspected urinary tract infection while awaiting urine culture, adjust if needed.  Push fluids.  Return for worsening symptoms.  Final Clinical Impressions(s) / UC Diagnoses   Final diagnoses:  Acute lower UTI  Acute MEE (middle ear effusion), left  Impacted cerumen of right ear   Discharge Instructions   None    ED Prescriptions     Medication Sig Dispense Auth. Provider   cephALEXin (KEFLEX) 500 MG capsule Take 1 capsule (500 mg total) by mouth 2 (two) times daily. 10 capsule Particia Nearing, PA-C   azelastine (ASTELIN) 0.1 % nasal spray Place 1 spray into both nostrils 2 (two) times daily. Use in each nostril as directed 30 mL Particia Nearing, PA-C   carbamide peroxide (DEBROX) 6.5 % OTIC solution Place 5 drops into the right ear 2 (two) times daily. 15 mL Particia Nearing, [place]      PDMP not reviewed this encounter.   Particia Nearing, [place] 03/26/24 1935

## 2024-03-29 LAB — URINE CULTURE

## 2024-03-30 ENCOUNTER — Telehealth (HOSPITAL_COMMUNITY): Payer: Self-pay

## 2024-03-30 MED ORDER — NITROFURANTOIN MONOHYD MACRO 100 MG PO CAPS: 100.0000 mg | ORAL_CAPSULE | Freq: Two times a day (BID) | ORAL | 0 refills | Status: AC

## 2024-03-30 NOTE — Telephone Encounter (Signed)
 Per Adrain Hopes, FNP, "Please have her stop keflex and start macrobid 100 mg BID for 5 days."  Attempted to reach patient x1. LVM.  Rx sent to pharmacy on file.
